# Patient Record
Sex: Female | Born: 1965 | Race: White | Hispanic: No | Smoking: Never smoker
Health system: Southern US, Community
[De-identification: ages and names within clinical notes are randomized; demographics above are authoritative.]

## PROBLEM LIST (undated history)

## (undated) DIAGNOSIS — R011 Cardiac murmur, unspecified: Secondary | ICD-10-CM

## (undated) DIAGNOSIS — E559 Vitamin D deficiency, unspecified: Secondary | ICD-10-CM

## (undated) HISTORY — PX: CHOLECYSTECTOMY: SHX55

## (undated) HISTORY — PX: WRIST SURGERY: SHX841

## (undated) HISTORY — DX: Cardiac murmur, unspecified: R01.1

## (undated) HISTORY — DX: Vitamin D deficiency, unspecified: E55.9

---

## 1998-12-15 ENCOUNTER — Other Ambulatory Visit: Admission: RE | Admit: 1998-12-15 | Discharge: 1998-12-15 | Payer: Self-pay | Admitting: Obstetrics and Gynecology

## 2000-03-28 ENCOUNTER — Other Ambulatory Visit: Admission: RE | Admit: 2000-03-28 | Discharge: 2000-03-28 | Payer: Self-pay | Admitting: Obstetrics and Gynecology

## 2000-11-27 ENCOUNTER — Encounter: Payer: Self-pay | Admitting: Family Medicine

## 2000-11-27 ENCOUNTER — Encounter: Admission: RE | Admit: 2000-11-27 | Discharge: 2000-11-27 | Payer: Self-pay | Admitting: Family Medicine

## 2000-12-31 ENCOUNTER — Encounter: Admission: RE | Admit: 2000-12-31 | Discharge: 2001-01-14 | Payer: Self-pay | Admitting: Neurosurgery

## 2001-02-19 ENCOUNTER — Emergency Department (HOSPITAL_COMMUNITY): Admission: EM | Admit: 2001-02-19 | Discharge: 2001-02-19 | Payer: Self-pay | Admitting: Emergency Medicine

## 2001-02-19 ENCOUNTER — Encounter: Payer: Self-pay | Admitting: Emergency Medicine

## 2002-01-26 ENCOUNTER — Other Ambulatory Visit: Admission: RE | Admit: 2002-01-26 | Discharge: 2002-01-26 | Payer: Self-pay | Admitting: Obstetrics and Gynecology

## 2002-02-26 ENCOUNTER — Encounter: Payer: Self-pay | Admitting: Obstetrics and Gynecology

## 2002-02-26 ENCOUNTER — Encounter: Admission: RE | Admit: 2002-02-26 | Discharge: 2002-02-26 | Payer: Self-pay | Admitting: Obstetrics and Gynecology

## 2002-09-02 ENCOUNTER — Encounter: Payer: Self-pay | Admitting: Obstetrics and Gynecology

## 2002-09-02 ENCOUNTER — Encounter: Admission: RE | Admit: 2002-09-02 | Discharge: 2002-09-02 | Payer: Self-pay | Admitting: Obstetrics and Gynecology

## 2003-03-07 ENCOUNTER — Encounter: Admission: RE | Admit: 2003-03-07 | Discharge: 2003-03-07 | Payer: Self-pay | Admitting: Obstetrics and Gynecology

## 2003-03-07 ENCOUNTER — Encounter: Payer: Self-pay | Admitting: Obstetrics and Gynecology

## 2003-04-21 ENCOUNTER — Other Ambulatory Visit: Admission: RE | Admit: 2003-04-21 | Discharge: 2003-04-21 | Payer: Self-pay | Admitting: Obstetrics and Gynecology

## 2004-08-27 ENCOUNTER — Emergency Department (HOSPITAL_COMMUNITY): Admission: EM | Admit: 2004-08-27 | Discharge: 2004-08-27 | Payer: Self-pay | Admitting: Emergency Medicine

## 2005-03-04 ENCOUNTER — Other Ambulatory Visit: Admission: RE | Admit: 2005-03-04 | Discharge: 2005-03-04 | Payer: Self-pay | Admitting: Obstetrics and Gynecology

## 2006-04-18 ENCOUNTER — Encounter: Admission: RE | Admit: 2006-04-18 | Discharge: 2006-04-18 | Payer: Self-pay | Admitting: Obstetrics and Gynecology

## 2008-12-09 ENCOUNTER — Encounter: Admission: RE | Admit: 2008-12-09 | Discharge: 2008-12-09 | Payer: Self-pay | Admitting: Obstetrics and Gynecology

## 2009-03-05 ENCOUNTER — Emergency Department (HOSPITAL_BASED_OUTPATIENT_CLINIC_OR_DEPARTMENT_OTHER): Admission: EM | Admit: 2009-03-05 | Discharge: 2009-03-05 | Payer: Self-pay | Admitting: Emergency Medicine

## 2009-03-05 ENCOUNTER — Ambulatory Visit: Payer: Self-pay | Admitting: Diagnostic Radiology

## 2009-04-05 ENCOUNTER — Ambulatory Visit (HOSPITAL_BASED_OUTPATIENT_CLINIC_OR_DEPARTMENT_OTHER): Admission: RE | Admit: 2009-04-05 | Discharge: 2009-04-05 | Payer: Self-pay | Admitting: Orthopedic Surgery

## 2010-10-26 LAB — POCT HEMOGLOBIN-HEMACUE: Hemoglobin: 14.6 g/dL (ref 12.0–15.0)

## 2011-12-25 ENCOUNTER — Other Ambulatory Visit: Payer: Self-pay | Admitting: Obstetrics and Gynecology

## 2011-12-25 DIAGNOSIS — R928 Other abnormal and inconclusive findings on diagnostic imaging of breast: Secondary | ICD-10-CM

## 2011-12-30 ENCOUNTER — Ambulatory Visit
Admission: RE | Admit: 2011-12-30 | Discharge: 2011-12-30 | Disposition: A | Discharge status: Home / Self Care | Payer: 59 | Source: Ambulatory Visit | Attending: Obstetrics and Gynecology | Admitting: Obstetrics and Gynecology

## 2011-12-30 ENCOUNTER — Other Ambulatory Visit: Payer: Self-pay | Admitting: Obstetrics and Gynecology

## 2011-12-30 DIAGNOSIS — R928 Other abnormal and inconclusive findings on diagnostic imaging of breast: Secondary | ICD-10-CM

## 2015-06-21 ENCOUNTER — Encounter (HOSPITAL_BASED_OUTPATIENT_CLINIC_OR_DEPARTMENT_OTHER): Payer: Self-pay

## 2015-06-21 ENCOUNTER — Emergency Department (HOSPITAL_BASED_OUTPATIENT_CLINIC_OR_DEPARTMENT_OTHER): Payer: Worker's Compensation

## 2015-06-21 ENCOUNTER — Emergency Department (HOSPITAL_BASED_OUTPATIENT_CLINIC_OR_DEPARTMENT_OTHER)
Admission: EM | Admit: 2015-06-21 | Discharge: 2015-06-21 | Disposition: A | Discharge status: Home / Self Care | Payer: Worker's Compensation | Attending: Emergency Medicine | Admitting: Emergency Medicine

## 2015-06-21 DIAGNOSIS — Y9289 Other specified places as the place of occurrence of the external cause: Secondary | ICD-10-CM | POA: Insufficient documentation

## 2015-06-21 DIAGNOSIS — W108XXA Fall (on) (from) other stairs and steps, initial encounter: Secondary | ICD-10-CM | POA: Insufficient documentation

## 2015-06-21 DIAGNOSIS — Y998 Other external cause status: Secondary | ICD-10-CM | POA: Diagnosis not present

## 2015-06-21 DIAGNOSIS — S99921A Unspecified injury of right foot, initial encounter: Secondary | ICD-10-CM | POA: Diagnosis present

## 2015-06-21 DIAGNOSIS — S93401A Sprain of unspecified ligament of right ankle, initial encounter: Secondary | ICD-10-CM | POA: Insufficient documentation

## 2015-06-21 DIAGNOSIS — S93601A Unspecified sprain of right foot, initial encounter: Secondary | ICD-10-CM

## 2015-06-21 DIAGNOSIS — Y9389 Activity, other specified: Secondary | ICD-10-CM | POA: Diagnosis not present

## 2015-06-21 NOTE — ED Notes (Signed)
Ice pack applied.

## 2015-06-21 NOTE — ED Provider Notes (Signed)
CSN: [id number]     Arrival date & time 06/21/15  2011 History   First MD Initiated Contact with Patient 06/21/15 2122     Chief Complaint  Patient presents with  . Foot Injury     (Consider location/radiation/quality/duration/timing/severity/associated sxs/prior Treatment) The history is provided by the patient and medical records. No language interpreter was used.     Becky Wang [facility] is a 49 y.o. female  with a hx of cholecystectomy presents to the Emergency Department complaining of acute, persistent pain to the right lateral foot after missing a step at work around 6 PM tonight. Patient reports pain to the right lateral foot and left medial ankle with associated swelling and ecchymosis. Patient reports she has been ambulatory with pain to her foot. She has taken ibuprofen with moderate relief. No numbness or tingling. No previous surgeries on the ankle.  Patient reports she did fall striking her right elbow however did not hit her head or have a loss of consciousness.   History reviewed. No pertinent past medical history. Past Surgical History  Procedure Laterality Date  . Cholecystectomy    . Wrist surgery     No family history on file. Social History  Substance Use Topics  . Smoking status: Never Smoker   . Smokeless tobacco: None  . Alcohol Use: Yes     Comment: occ   OB History    No data available     Review of Systems  Constitutional: Negative for fever and chills.  Gastrointestinal: Negative for nausea and vomiting.  Musculoskeletal: Positive for joint swelling and arthralgias. Negative for back pain, neck pain and neck stiffness.  Skin: Negative for wound.  Neurological: Negative for numbness.  Hematological: Does not bruise/bleed easily.  Psychiatric/Behavioral: The patient is not nervous/anxious.   All other systems reviewed and are negative.     Allergies  Review of patient's allergies indicates no known allergies.  Home Medications   Prior to  Admission medications   Not on File   BP 131/59 mmHg  Pulse 61  Temp(Src) 98.5 F (36.9 C) (Oral)  Resp 18  Ht  (1.676 m)  Wt 80.287 kg  BMI 28.58 kg/m2  SpO2 99% Physical Exam  Constitutional: She appears well-developed and well-nourished. No distress.  HENT:  Head: Normocephalic and atraumatic.  Eyes: Conjunctivae are normal.  Neck: Normal range of motion.  Cardiovascular: Normal rate, regular rhythm and intact distal pulses.   Capillary refill < 3 sec  Pulmonary/Chest: Effort normal and breath sounds normal.  Musculoskeletal: She exhibits tenderness. She exhibits no edema.  ROM: Full range of motion of the right hip, knee and ankle Full range of motion of all toes of the right foot Mild ecchymosis to the right lateral foot and right medial ankle with tenderness to palpation in these areas Full range of motion of all major joints in the bilateral upper extremities and left lower extremity without pain  Neurological: She is alert. Coordination normal.  Sensation intact to dull and sharp throughout the right lower extremity Strength 5/5 in the right lower extremity  Skin: Skin is warm and dry. She is not diaphoretic.  No tenting of the skin Abrasion to the right elbow  Psychiatric: She has a normal mood and affect.  Nursing note and vitals reviewed.   ED Course  Procedures (including critical care time) Labs Review Labs Reviewed - No data to display  Imaging Review Dg Foot Complete Right  06/21/2015  CLINICAL DATA:  Status post rolling  of the right foot with right foot pain EXAM: RIGHT FOOT COMPLETE - 3+ VIEW COMPARISON:  None. FINDINGS: There is no evidence of fracture or dislocation. There is minimal plantar calcaneal spur. Soft tissues are unremarkable. IMPRESSION: No acute fracture or dislocation. Electronically Signed   By: Sherian ReinWei-Chen  Lin M.D.   On: 06/21/2015 20:42   I have personally reviewed and evaluated these images and lab results as part of my medical  decision-making.   EKG Interpretation None      MDM   Final diagnoses:  Right ankle sprain, initial encounter  Right foot sprain, initial encounter   [facility] pain.  Patient X-Ray negative for obvious fracture or dislocation. Pain managed in ED. Pt advised to follow up with orthopedics if symptoms persist for possibility of missed fracture diagnosis. Patient given brace while in ED, conservative therapy recommended and discussed. Patient will be dc home & is agreeable with above plan.  BP 131/59 mmHg  Pulse 61  Temp(Src) 98.5 F (36.9 C) (Oral)  Resp 18  Ht 5\' 6"  (1.676 m)  Wt 80.287 kg  BMI 28.58 kg/m2  SpO2 99%   Dierdre ForthHannah Simora Dingee, PA-C 06/21/15 2203  Geoffery Lyonsouglas Delo, MD 06/21/15 2312

## 2015-06-21 NOTE — ED Notes (Signed)
Missed a step at work approx 6pm-pain to right lateral foot

## 2015-06-21 NOTE — Discharge Instructions (Signed)
1. Medications: alternate naprosyn and tylenol for pain control, usual home medications °2. Treatment: rest, ice, elevate and use brace, drink plenty of fluids, gentle stretching °3. Follow Up: Please followup with orthopedics as directed or your PCP in 1 week if no improvement for discussion of your diagnoses and further evaluation after today's visit; if you do not have a primary care doctor use the resource guide provided to find one; Please return to the ER for worsening symptoms or other concerns ° ° ° °Ankle Sprain °An ankle sprain is an injury to the strong, fibrous tissues (ligaments) that hold the bones of your ankle joint together.  °CAUSES °An ankle sprain is usually caused by a fall or by twisting your ankle. Ankle sprains most commonly occur when you step on the outer edge of your foot, and your ankle turns inward. People who participate in sports are more prone to these types of injuries.  °SYMPTOMS  °· Pain in your ankle. The pain may be present at rest or only when you are trying to stand or walk. °· Swelling. °· Bruising. Bruising may develop immediately or within 1 to 2 days after your injury. °· Difficulty standing or walking, particularly when turning corners or changing directions. °DIAGNOSIS  °Your caregiver will ask you details about your injury and perform a physical exam of your ankle to determine if you have an ankle sprain. During the physical exam, your caregiver will press on and apply pressure to specific areas of your foot and ankle. Your caregiver will try to move your ankle in certain ways. An X-ray exam may be done to be sure a bone was not broken or a ligament did not separate from one of the bones in your ankle (avulsion fracture).  °TREATMENT  °Certain types of braces can help stabilize your ankle. Your caregiver can make a recommendation for this. Your caregiver may recommend the use of medicine for pain. If your sprain is severe, your caregiver may refer you to a surgeon who  helps to restore function to parts of your skeletal system (orthopedist) or a physical therapist. °HOME CARE INSTRUCTIONS  °· Apply ice to your injury for 1-2 days or as directed by your caregiver. Applying ice helps to reduce inflammation and pain. °¨ Put ice in a plastic bag. °¨ Place a towel between your skin and the bag. °¨ Leave the ice on for 15-20 minutes at a time, every 2 hours while you are awake. °· Only take over-the-counter or prescription medicines for pain, discomfort, or fever as directed by your caregiver. °· Elevate your injured ankle above the level of your heart as much as possible for 2-3 days. °· If your caregiver recommends crutches, use them as instructed. Gradually put weight on the affected ankle. Continue to use crutches or a cane until you can walk without feeling pain in your ankle. °· If you have a plaster splint, wear the splint as directed by your caregiver. Do not rest it on anything harder than a pillow for the first 24 hours. Do not put weight on it. Do not get it wet. You may take it off to take a shower or bath. °· You may have been given an elastic bandage to wear around your ankle to provide support. If the elastic bandage is too tight (you have numbness or tingling in your foot or your foot becomes cold and blue), adjust the bandage to make it comfortable. °· If you have an air splint, you may blow more   air into it or let air out to make it more comfortable. You may take your splint off at night and before taking a shower or bath. Wiggle your toes in the splint several times per day to decrease swelling. °SEEK MEDICAL CARE IF:  °· You have rapidly increasing bruising or swelling. °· Your toes feel extremely cold or you lose feeling in your foot. °· Your pain is not relieved with medicine. °SEEK IMMEDIATE MEDICAL CARE IF: °· Your toes are numb or blue. °· You have severe pain that is increasing. °MAKE SURE YOU:  °· Understand these instructions. °· Will watch your  condition. °· Will get help right away if you are not doing well or get worse. °  °This information is not intended to replace advice given to you by your health care provider. Make sure you discuss any questions you have with your health care provider. °  °Document Released: 07/08/2005 Document Revised: 07/29/2014 Document Reviewed: 07/20/2011 °Elsevier Interactive Patient Education ©2016 Elsevier Inc. ° ° ° °Emergency Department Resource Guide °1) Find a Doctor and Pay Out of Pocket °Although you won't have to find out who is covered by your insurance plan, it is a good idea to ask around and get recommendations. You will then need to call the office and see if the doctor you have chosen will accept you as a new patient and what types of options they offer for patients who are self-pay. Some doctors offer discounts or will set up payment plans for their patients who do not have insurance, but you will need to ask so you aren't surprised when you get to your appointment. ° °2) Contact Your Local Health Department °Not all health departments have doctors that can see patients for sick visits, but many do, so it is worth a call to see if yours does. If you don't know where your local health department is, you can check in your phone book. The CDC also has a tool to help you locate your state's health department, and many state websites also have listings of all of their local health departments. ° °3) Find a Walk-in Clinic °If your illness is not likely to be very severe or complicated, you may want to try a walk in clinic. These are popping up all over the country in pharmacies, drugstores, and shopping centers. They're usually staffed by nurse practitioners or physician assistants that have been trained to treat common illnesses and complaints. They're usually fairly quick and inexpensive. However, if you have serious medical issues or chronic medical problems, these are probably not your best option. ° °No Primary  Care Doctor: °- Call Health Connect at  832-8000 - they can help you locate a primary care doctor that  accepts your insurance, provides certain services, etc. °- Physician Referral Service- 1-800-533-3463 ° °Chronic Pain Problems: °Organization         Address  Phone   Notes  °[facility]  (336) 297-2271 Patients need to be referred by their primary care doctor.  ° °Medication Assistance: °Organization         Address  Phone   Notes  °[place] [facility] [address] °[place], [place] [address] (336) 641-8030 --Must be a resident of [place] °-- Must have NO insurance coverage whatsoever (no Medicaid/ Medicare, etc.) °-- The pt. MUST have a primary care doctor that directs their care regularly and follows them in the community °  °[facility]  (866) 331-1348   °  [employer]  (888) 892-1162   ° °Agencies that provide inexpensive medical care: °Organization         Address  Phone   Notes  °[facility]  (336) 832-8035   °[facility]    (336) 832-7272   °[facility] [address] °[place], [place] [address] (336) 832-4777   °[facility] [address], °[place] (336) 271-4999   °[facility]    (336) 373-0678   °[facility]    (336) 272-1050   °[facility] ° [address], [place] Phone:  (336) 832-4444, Fax:  (336) 832-4440 Hours of Operation:  9 am - 6 pm, M-F.  Also accepts Medicaid/Medicare and self-pay.  °[facility] ° [address], [place] Phone: (336) 832-3150, Fax: (336) 832-3151. Hours of Operation:  8:30 am - 5:30 pm, M-F.  Also accepts Medicaid and self-pay.  °[facility] [place] [address], [place] Phone: (336) 878-6027   °[facility] [address], [place], [place] (336)723-1848, Ext. 123 Mondays & Thursdays: 7-9 AM.  First 15 patients are seen on a first  come, first serve basis. °  ° °Medicaid-accepting [place] Providers: ° °Organization         Address  Phone   Notes  °[facility] [address], [place] (336) 641-2100 Also accepts self-pay patients.  °[facility] [address], [place] ° (336) 856-9996   °[facility] [address], [place] (336) 288-8857   °[facility] [address] [place] [address], [place] (336) 299-7000   °Veita Bland [address], [place]  ° (336) 373-1557 Only accepts [place] Access Medicaid patients after they have their name applied to their card.  ° °Self-Pay (no insurance) in [place]: ° °Organization         Address  Phone   Notes  °Sickle Cell Patients, [facility] [address], [place] (336) 832-1970   °[facility] [address], [place] (336) 832-4400   °[facility] ° [address], [place] (336) 992-4800   °[facility]/Dr. Osei-Bonsu ° [address] [place] [address], [place] or [address], [place] (336) 841-8500 Phone number for both [place] and [place] locations is the same.  °[facility] [address], [place] (336) 299-0000   °[facility] [address] [place] [address], [place] or [address] (336) 852-7530 °(336) 878-2260   °[facility] [address], [place] (336) 350-1642, phone; (336) 294-5005, fax Sees patients 1st and 3rd Saturday of every month.  Must not qualify for public or private insurance (i.e. Medicaid, Medicare, [place] Health Choice, Veterans' Benefits) • Household income should be no more than 200% of the poverty level •The clinic cannot treat you if you are pregnant or think you are pregnant • Sexually transmitted diseases are not treated at the clinic.  ° ° °Dental Care: °Organization          Address  Phone  Notes  °[facility] [facility] [address],  (336) 641-6152 Accepts children up to age 21 who are enrolled in Medicaid or [place] Health Choice; pregnant women with a Medicaid card;   and children who have applied for Medicaid or [place] Health Choice, but were declined, whose parents can pay a reduced fee at time of service.  °[facility] [place]  [address], [place] (336) 641-7733 Accepts children up to age 21 who are enrolled in Medicaid or [place] Health Choice; pregnant women with a Medicaid card; and children who have applied for Medicaid or [place] Health Choice, but were declined, whose parents can pay a reduced fee at time of service.  °[facility] ° [address], [place] (336) 641-4533 Patients are seen by appointment only. Walk-ins are not accepted. [facility] will see patients 18 years of age and older. °Monday - Tuesday (8am-5pm) °Most Wednesdays (8:30-5pm) °$30 per visit, cash only  °[facility] ° [address], [place] (336) 641-4533 Patients are seen by appointment only. Walk-ins are not accepted. [facility] will see patients 18 years of age and older. °One Wednesday Evening (Monthly: Volunteer Based).  $30 per visit, cash only  °[facility]  (919) 537-3737 for adults; Children under age 4, call Graduate Pediatric Dentistry at ([phone]) [phone]. Children aged 4-14, please call ([phone]) [phone] to request a pediatric application. ° Dental services are provided in all areas of dental care including fillings, crowns and bridges, complete and partial dentures, implants, gum treatment, root canals, and extractions. Preventive care is also provided. Treatment is provided to both adults and children. °Patients are selected via a lottery and there is often a waiting list. °  °[facility] [address], °[place] ° (336) 763-8833 [email or web address] °  °[facility] [address], [place], [place] (336)723-1848, Ext. 123 Second and Fourth Thursday of each month, opens at 6:30 AM; Clinic ends at 9 AM.  Patients are seen on a first-come first-served basis, and a limited number are seen during each clinic.  ° °[facility] ° [address], [place], [place] (336) 723-7904   Eligibility Requirements °You must have lived in [place], [place], or [place] counties for at least the last three months. °  You cannot be eligible for state or federal sponsored healthcare insurance, including Veterans Administration, Medicaid, or Medicare. °  You generally cannot be eligible for healthcare insurance through your employer.  °  How to apply: °Eligibility screenings are held every Tuesday and Wednesday afternoon from 1:00 pm until 4:00 pm. You do not need an appointment for the interview!  °[facility] [address], [place], [place] 336-631-2330   °[facility]  336-342-8273   °[facility]  336-703-3100   °[facility]  336-570-6415   ° °Behavioral Health Resources in the Community: °Intensive Outpatient Programs °Organization         Address  Phone  Notes  °[facility] [address], [place], [place] 336-878-6098   °[facility] [address], [place], [place] 336-832-9800   °[facility] [address], [place], [place] ° 336-882-2125   °[facility] [address],  °[place], [place] 1-800-853-5163 or 336-641-4981   °Substance Abuse Resources °Organization         Address  Phone  Notes  °[facility]  336-882-2125   °[facility]  336-784-9470   °[facility]  336-285-9073   °[facility]  336-845-3988   °Residential &   Outpatient Substance Abuse Program  1-800-659-3381   °Psychological  Services °Organization         Address  Phone  Notes  °[facility]  336- 832-9600   °[facility]  336- 378-7881   °[facility] [address], [place] 1-800-853-5163 or 336-641-4981   ° °Mobile Crisis Teams °Organization         Address  Phone  Notes  °[facility], Mobile Crisis Care Unit  1-877-626-1772   °Assertive °[facility] ° [address] [place], [place] 336-834-9664   °Sharon DeEsch [address] °[place] 336-554-5454   ° °Self-Help/Support Groups °Organization         Address  Phone             Notes  °[facility] [place] - variety of support groups  336- 373-1402 Call for more information  °Narcotics Anonymous (NA), [facility] [address], °[place]  2 meetings at this location  ° °Residential Treatment Programs °Organization         Address  Phone  Notes  °[facility] [address],    °[place]  1-866-801-8205   °[facility] ° [address], [place], [place] 704-293-8524   °[facility] [address], [place] 336-845-3988 Admissions: 8am-3pm M-F  °[facility] [address]    °[place], [place] 336-841-1104   °[facility] [address], [place], [place] 336-379-7146   °[facility] [address]  °[place], [place] 336-285-9073   °[facility] Intensive Outpatient [address], [place], [place] 336-852-3033   °[facility] [address]  °[place], [place] 1-877-615-2722 or 336-784-9470   °[facility] [address] [place], [place] 336-227-7417 Accepts Medicaid  °[facility] [address]  ° [place] 1-800-659-3381 Substance Abuse/Addiction Treatment  ° °[place] Behavioral Health Resources °Organization         Address  Phone  Notes  °[facility]  (888)  581-9988   °Julie Brannon, PhD [address] [place], [place]   (336) 349-5553 or (336) 951-0000   °[facility]   [address] °[place], [place] (336) 349-4454   °[facility] [address], [place], [place] (336) 342-8316 Insurance/Medicaid/sponsorship through [facility]  °[facility] [address]                                     [place], [place] (336) 342-8316 Therapy/tele-psych/case  °[facility] [address]  ° [place], [place] (336) 349-2233    °Dr. Arfeen  (336) 349-4544   °[facility] [place]  [employer] [place] [facility] 1) [address], [place] °2) [address], [place] °3)  [address], [place] (336) 349-3220 °(336) 342-7768 ° °(336) 342-8140   °[place] [facility] (336) 342-1394 or (336) 342-3537 (After Hours)    ° ° ° °

## 2019-03-05 ENCOUNTER — Telehealth: Payer: Self-pay

## 2019-03-05 NOTE — Telephone Encounter (Signed)
Notes on file from Dr Radene Knee [phone] sent referral to scheduling

## 2019-04-05 ENCOUNTER — Ambulatory Visit (INDEPENDENT_AMBULATORY_CARE_PROVIDER_SITE_OTHER): Payer: 59 | Admitting: Cardiology

## 2019-04-05 ENCOUNTER — Other Ambulatory Visit: Payer: Self-pay

## 2019-04-05 ENCOUNTER — Encounter: Payer: Self-pay | Admitting: Cardiology

## 2019-04-05 VITALS — BP 140/92 | HR 58 | Temp 97.9°F | Ht 65.0 in | Wt 207.8 lb

## 2019-04-05 DIAGNOSIS — R0602 Shortness of breath: Secondary | ICD-10-CM | POA: Diagnosis not present

## 2019-04-05 DIAGNOSIS — Z01812 Encounter for preprocedural laboratory examination: Secondary | ICD-10-CM | POA: Diagnosis not present

## 2019-04-05 DIAGNOSIS — R011 Cardiac murmur, unspecified: Secondary | ICD-10-CM

## 2019-04-05 DIAGNOSIS — R002 Palpitations: Secondary | ICD-10-CM | POA: Diagnosis not present

## 2019-04-05 DIAGNOSIS — R079 Chest pain, unspecified: Secondary | ICD-10-CM | POA: Diagnosis not present

## 2019-04-05 DIAGNOSIS — R001 Bradycardia, unspecified: Secondary | ICD-10-CM

## 2019-04-05 MED ORDER — METOPROLOL TARTRATE 50 MG PO TABS: 50.0000 mg | ORAL_TABLET | Freq: Once | ORAL | 0 refills | Status: DC

## 2019-04-05 NOTE — Progress Notes (Signed)
Cardiology Office Note:    Date:  04/05/2019   ID:  Becky Wang, DOB 22-May-1966, MRN [id number]  PCP:  Patient, [name] Per  Cardiologist:  Thomasene Ripple, DO  Electrophysiologist:  None   Referring MD: Richardean Chimera, MD   "Here to be checked out for chest pain".   History of Present Illness:    Becky Wang is a 53 y.o. female with a hx of reported heart murmur, vitamin D deficiency and family history of premature coronary disease comes in to be evaluated for chest pain and palpitations.  The patient reports that recently she has been experiencing left-sided chest pain which is nonradiating.  She however describes it as dull in nature and just very intense.  She states that it happens both at rest and on exertion.  She notes that the last for about 5 to 10 minutes.  Which she quantifies at 5 out of 10.  She admits to associated shortness of breath, denies any nausea, vomiting, lightheadedness.  In addition she also tells me that she has been experiencing palpitations for couple months now.  She states that usually during the day she does not experience any palpitation however at night she had rapid onset of palpitations which lasts for at least 20 to 30 minutes.  She states while this is happening she feels like her heart is also skipping a beat.  She denies any lightheadedness or dizziness.  However also does have shortness of breath during these episodes.  Chest pain in the office today.   Past Medical History:  Diagnosis Date  . Heart murmur   . Vitamin D deficiency     Past Surgical History:  Procedure Laterality Date  . CHOLECYSTECTOMY    . WRIST SURGERY      Current Medications: Current Meds  Medication Sig  . meloxicam (MOBIC) 15 MG tablet Take 15 mg by mouth daily as needed.  Marland Kitchen oxybutynin (DITROPAN XL) 15 MG 24 hr tablet Take 15 mg by mouth daily.  . Vitamin D, Ergocalciferol, (DRISDOL) 1.25 MG (50000 UT) CAPS capsule Take 50,000 Units by mouth every 7 (seven)  days.     Allergies:   Patient has no known allergies.   Social History   Socioeconomic History  . Marital status: Divorced    Spouse name: Not on file  . Number of children: Not on file  . Years of education: Not on file  . Highest education level: Not on file  Occupational History  . Not on file  Social Needs  . Financial resource strain: Not on file  . Food insecurity    Worry: Not on file    Inability: Not on file  . Transportation needs    Medical: Not on file    Non-medical: Not on file  Tobacco Use  . Smoking status: Never Smoker  . Smokeless tobacco: Never Used  Substance and Sexual Activity  . Alcohol use: Yes    Comment: occ  . Drug use: No  . Sexual activity: Yes    Birth control/protection: I.U.D.  Lifestyle  . Physical activity    Days per week: Not on file    Minutes per session: Not on file  . Stress: Not on file  Relationships  . Social Musician on phone: Not on file    Gets together: Not on file    Attends religious service: Not on file    Active member of club or organization: Not on file  Attends meetings of clubs or organizations: Not on file    Relationship status: Not on file  Other Topics Concern  . Not on file  Social History Narrative  . Not on file     Family History: The patient's family history includes Congestive Heart Failure in an other family member; Fibromyalgia in her sister; Heart attack in her father; Hypertension in her mother; Other in her sister.  ROS:      Review of Systems  Constitution: Negative for decreased appetite, fever and weight loss.  HENT: Negative for congestion, hearing loss and sore throat.   Eyes: Negative for blurred vision, double vision and photophobia.  Cardiovascular: Positive for chest pain and palpitations. Negative for near-syncope and syncope.  Respiratory: Positive for shortness of breath. Negative for cough, sputum production and wheezing.   Endocrine: Negative for cold  intolerance and polyuria.  Skin: Negative for nail changes, poor wound healing and suspicious lesions.  Musculoskeletal: Negative for arthritis and joint pain.  Gastrointestinal: Negative for bloating, constipation, hemorrhoids and nausea.  Genitourinary: Negative for bladder incontinence, genital sores, hematuria and incomplete emptying.  Neurological: Negative for excessive daytime sleepiness, focal weakness, seizures and weakness.  Psychiatric/Behavioral: Negative for depression and substance abuse. The patient does not have insomnia.   Allergic/Immunologic: Negative for HIV exposure and persistent infections.     EKGs/Labs/Other Studies Reviewed:    The following studies were reviewed today:   EKG: The ekg ordered today demonstrates sinus bradycardia, heart rate 58 bpm.  Recent Labs: No results found for requested labs within last 8760 hours.  Recent Lipid Panel No results found for: CHOL, TRIG, HDL, CHOLHDL, VLDL, LDLCALC, LDLDIRECT  Physical Exam:    VS:  BP (!) 140/92 (BP Location: Left Arm, Patient Position: Sitting, Cuff Size: Large)   Pulse (!) 58   Temp 97.9 F (36.6 C)   Ht 5\' 5"  (1.651 m)   Wt 207 lb 12.8 oz (94.3 kg)   SpO2 98%   BMI 34.58 kg/m     Wt Readings from Last 3 Encounters:  04/05/19 207 lb 12.8 oz (94.3 kg)  06/21/15 177 lb (80.3 kg)     GEN: Well nourished, well developed in no acute distress HEENT: Normal NECK: No JVD; No carotid bruits LYMPHATICS: No lymphadenopathy CARDIAC: S1-S2 noted RRR, II/VI soft systolic murmurs, rubs, gallops RESPIRATORY:  Clear to auscultation without rales, wheezing or rhonchi  ABDOMEN: Soft, non-tender, non-distended EXTREMITIES: No cyanosis, no clubbing, no edema. MUSCULOSKELETAL:  No edema; No deformity  SKIN: Warm and dry NEUROLOGIC:  Alert and oriented x 3 PSYCHIATRIC:  Normal affect   ASSESSMENT:    1. Palpitations   2. Chest pain, unspecified type   3. Pre-procedure lab exam   4. Shortness of  breath   5. Murmur, heart    PLAN:    In order of problems listed above:  She does have family history of coronary artery disease therefore her chest pain does concerns me .with this is appropriate to a CTA of the coronaries.  She has been advised of the risk and benefits of this procedure.  She does not have any contrast allergies.   She will wear a zio patch for 7 days to investigate her palpitations.  A transthoracic echocardiogram has been ordered to assess LV function/RV function and also for any structural abnormalities. She does have systolic murmur which I do believe is physiologic.  Her blood pressure is elevated in the office today: Right 140/90 left upper arm 142/92-this was manually  performed.  She reports that her blood pressure is always high when she is in a doctor's office and takes it daily at home.  She tells me her readings are usually between 128-130 with diastolics in the 70s.  Of asked the patient to take her blood pressure daily, report in 2 months with her blood pressure monitor system at which time we will reassess.  EKG with a heart rate of 58, TSH normal within the last year.  She is not on any AV nodal blockers. No lightheadedness or dizziness.  We will continue to monitor.  In addition she would like to exercise, and per our discussion this will be fine and she can exercise at home as tolerated.  She was advised if you experience any worsening chest pain or palpitation.  To stop the exercise and if persist go to the nearest ED.  She is in agreement with the above plan.  All her questions were answered during this visit.  She left office in stable condition.    Medication Adjustments/Labs and Tests Ordered: Current medicines are reviewed at length with the patient today.  Concerns regarding medicines are outlined above.  Orders Placed This Encounter  Procedures  . CT CORONARY FRACTIONAL FLOW RESERVE DATA PREP  . CT CORONARY FRACTIONAL FLOW RESERVE FLUID ANALYSIS   . CT CORONARY MORPH W/CTA COR W/SCORE W/CA W/CM &/OR WO/CM  . Basic Metabolic Panel (BMET)  . LONG TERM MONITOR (3-14 DAYS)  . EKG 12-Lead  . ECHOCARDIOGRAM COMPLETE   Meds ordered this encounter  Medications  . metoprolol tartrate (LOPRESSOR) 50 MG tablet    Sig: Take 1 tablet (50 mg total) by mouth once for 1 dose.    Dispense:  2 tablet    Refill:  0    Patient Instructions  Medication Instructions:  Your physician has recommended you make the following change in your medication:   TAKE: Metoprolol 50 mg Take 2 pills 2 hours prior to CT  If you need a refill on your cardiac medications before your next appointment, please call your pharmacy.   Lab work: Your physician recommends that you return for lab work in:   3-7 days prior to CT: BMP  If you have labs (blood work) drawn today and your tests are completely normal, you will receive your results only by: Marland Kitchen. MyChart Message (if you have MyChart) OR . A paper copy in the mail If you have any lab test that is abnormal or we need to change your treatment, we will call you to review the results.  Testing/Procedures: Your physician has requested that you have an echocardiogram. Echocardiography is a painless test that uses sound waves to create images of your heart. It provides your doctor with information about the size and shape of your heart and how well your heart's chambers and valves are working. This procedure takes approximately one hour. There are no restrictions for this procedure.  Your physician has requested that you have cardiac CT. Cardiac computed tomography (CT) is a painless test that uses an x-ray machine to take clear, detailed pictures of your heart. For further information please visit [email or web address]. Please follow instruction sheet as given.  Your cardiac CT will be scheduled at one of the below locations:   [facility] [address] [place], [place] [address] [phone]  If  scheduled at [facility], please arrive at the [facility] main entrance of [facility]  30-45 minutes prior to test start time. Proceed  to the [facility] Radiology Department (first floor) to check-in and test prep.   Please follow these instructions carefully (unless otherwise directed):  On the Night Before the Test: . Be sure to Drink plenty of water. . Do not consume any caffeinated/decaffeinated beverages or chocolate 12 hours prior to your test. . Do not take any antihistamines 12 hours prior to your test.  On the Day of the Test: . Drink plenty of water. Do not drink any water within one hour of the test. . Do not eat any food 4 hours prior to the test. . You may take your regular medications prior to the test.  . Take metoprolol (Lopressor) two hours prior to test. . FEMALES- please wear underwire-free bra if available     After the Test: . Drink plenty of water. . After receiving IV contrast, you may experience a mild flushed feeling. This is normal. . On occasion, you may experience a mild rash up to 24 hours after the test. This is not dangerous. If this occurs, you can take Benadryl 25 mg and increase your fluid intake. . If you experience trouble breathing, this can be serious. If it is severe call 911 IMMEDIATELY. If it is mild, please call our office.  Please contact the cardiac imaging nurse navigator should you have any questions/concerns Rockwell AlexandriaSara Wallace, RN Navigator Cardiac Imaging [facility] [phone] Office  [phone] Cell   Follow-Up: At [facility], you and your health needs are our priority.  As part of our continuing mission to provide you with exceptional heart care, we have created designated Provider Care Teams.  These Care Teams include your primary Cardiologist (physician) and Advanced Practice Providers (APPs -  Physician Assistants and Nurse Practitioners) who all work together to provide you with the  care you need, when you need it. You will need a follow up appointment in 2 months. You may see Gwynevere Lizana, DO. Any Other Special Instructions Will Be Listed Below (If Applicable).       Osvaldo ShipperSigned, Briseidy Spark, DO  04/05/2019 9:57 AM    [facility]

## 2019-04-05 NOTE — Patient Instructions (Addendum)
Medication Instructions:  Your physician has recommended you make the following change in your medication:   TAKE: Metoprolol 50 mg Take 2 pills 2 hours prior to CT  If you need a refill on your cardiac medications before your next appointment, please call your pharmacy.   Lab work: Your physician recommends that you return for lab work in:   3-7 days prior to CT: BMP  If you have labs (blood work) drawn today and your tests are completely normal, you will receive your results only by: Marland Kitchen MyChart Message (if you have MyChart) OR . A paper copy in the mail If you have any lab test that is abnormal or we need to change your treatment, we will call you to review the results.  Testing/Procedures: Your physician has requested that you have an echocardiogram. Echocardiography is a painless test that uses sound waves to create images of your heart. It provides your doctor with information about the size and shape of your heart and how well your heart's chambers and valves are working. This procedure takes approximately one hour. There are no restrictions for this procedure.  Your physician has recommended that you wear a ZIO monitor. ZIO monitors are medical devices that record the heart's electrical activity. Doctors most often use these monitors to diagnose arrhythmias. Arrhythmias are problems with the speed or rhythm of the heartbeat. The monitor is a small, portable device. You can wear one while you do your normal daily activities. This is usually used to diagnose what is causing palpitations/syncope (passing out).  Wear 14 days  Your physician has requested that you have cardiac CT. Cardiac computed tomography (CT) is a painless test that uses an x-ray machine to take clear, detailed pictures of your heart. For further information please visit [email or web address].[email or web address]. Please follow instruction sheet as given.  Your cardiac CT will be scheduled at one of the below locations:   [facility] [address] [place], [place] [address] [phone]  If scheduled at [facility], please arrive at the [facility] main entrance of [facility] 30-45 minutes prior to test start time. Proceed to the [facility] Radiology Department (first floor) to check-in and test prep.   Please follow these instructions carefully (unless otherwise directed):  On the Night Before the Test: . Be sure to Drink plenty of water. . Do not consume any caffeinated/decaffeinated beverages or chocolate 12 hours prior to your test. . Do not take any antihistamines 12 hours prior to your test.  On the Day of the Test: . Drink plenty of water. Do not drink any water within one hour of the test. . Do not eat any food 4 hours prior to the test. . You may take your regular medications prior to the test.  . Take metoprolol (Lopressor) two hours prior to test. . FEMALES- please wear underwire-free bra if available     After the Test: . Drink plenty of water. . After receiving IV contrast, you may experience a mild flushed feeling. This is normal. . On occasion, you may experience a mild rash up to 24 hours after the test. This is not dangerous. If this occurs, you can take Benadryl 25 mg and increase your fluid intake. . If you experience trouble breathing, this can be serious. If it is severe call 911 IMMEDIATELY. If it is mild, please call our office.  Please contact the cardiac imaging nurse navigator should you have any questions/concerns Marchia Bond, RN Navigator Cardiac  Imaging [facility] [phone] Office  [phone] Cell   Follow-Up: At [facility], you and your health needs are our priority.  As part of our continuing mission to provide you with exceptional heart care, we have created designated Provider Care Teams.  These Care Teams include your primary Cardiologist (physician) and Advanced Practice Providers (APPs -  Physician  Assistants and Nurse Practitioners) who all work together to provide you with the care you need, when you need it. You will need a follow up appointment in 2 months. You may see Kardie Tobb, DO. Any Other Special Instructions Will Be Listed Below (If Applicable).

## 2019-04-08 ENCOUNTER — Other Ambulatory Visit: Payer: Self-pay

## 2019-04-08 ENCOUNTER — Ambulatory Visit (HOSPITAL_BASED_OUTPATIENT_CLINIC_OR_DEPARTMENT_OTHER)
Admission: RE | Admit: 2019-04-08 | Discharge: 2019-04-08 | Disposition: A | Discharge status: Home / Self Care | Payer: 59 | Source: Ambulatory Visit | Attending: Cardiology | Admitting: Cardiology

## 2019-04-08 DIAGNOSIS — R079 Chest pain, unspecified: Secondary | ICD-10-CM

## 2019-04-08 NOTE — Progress Notes (Signed)
  Echocardiogram 2D Echocardiogram has been performed.  Cardell Peach 04/08/2019, 3:21 PM

## 2019-04-12 ENCOUNTER — Ambulatory Visit (INDEPENDENT_AMBULATORY_CARE_PROVIDER_SITE_OTHER): Payer: 59

## 2019-04-12 DIAGNOSIS — R002 Palpitations: Secondary | ICD-10-CM | POA: Diagnosis not present

## 2019-05-06 ENCOUNTER — Telehealth (HOSPITAL_COMMUNITY): Payer: Self-pay | Admitting: Emergency Medicine

## 2019-05-06 NOTE — Telephone Encounter (Signed)
Reaching out to patient to offer assistance regarding upcoming cardiac imaging study; pt verbalizes understanding of appt date/time, parking situation and where to check in, pre-test NPO status and medications ordered, and verified current allergies; name and call back number provided for further questions should they arise Shady Padron RN Navigator Cardiac Imaging [facility] [phone] office [phone] cell 

## 2019-05-07 ENCOUNTER — Ambulatory Visit (HOSPITAL_COMMUNITY)
Admission: RE | Admit: 2019-05-07 | Discharge: 2019-05-07 | Disposition: A | Discharge status: Home / Self Care | Payer: 59 | Source: Ambulatory Visit | Attending: Cardiology | Admitting: Cardiology

## 2019-05-07 ENCOUNTER — Other Ambulatory Visit: Payer: Self-pay

## 2019-05-07 DIAGNOSIS — R079 Chest pain, unspecified: Secondary | ICD-10-CM

## 2019-05-07 DIAGNOSIS — Z006 Encounter for examination for normal comparison and control in clinical research program: Secondary | ICD-10-CM

## 2019-05-07 MED ORDER — NITROGLYCERIN 0.4 MG SL SUBL
0.8000 mg | SUBLINGUAL_TABLET | Freq: Once | SUBLINGUAL | Status: AC
  Administered 2019-05-07: 0.8 mg via SUBLINGUAL

## 2019-05-07 MED ORDER — IOHEXOL 350 MG/ML SOLN
80.0000 mL | Freq: Once | INTRAVENOUS | Status: AC | PRN
  Administered 2019-05-07: 13:00:00 80 mL via INTRAVENOUS

## 2019-05-07 MED ORDER — NITROGLYCERIN 0.4 MG SL SUBL
SUBLINGUAL_TABLET | SUBLINGUAL | Status: AC
  Filled 2019-05-07: qty 2

## 2019-05-07 NOTE — Research (Signed)
Cadfem Informed Consent    Patient Name: Becky Wang [facility]   Subject met inclusion and exclusion criteria.  The informed consent form, study requirements and expectations were reviewed with the subject and questions and concerns were addressed prior to the signing of the consent form.  The subject verbalized understanding of the trail requirements.  The subject agreed to participate in the CADFEM trial and signed the informed consent.  The informed consent was obtained prior to performance of any protocol-specific procedures for the subject.  A copy of the signed informed consent was given to the subject and a copy was placed in the subject's medical record.   Neva Seat

## 2019-05-07 NOTE — Progress Notes (Signed)
CT scan completed. Tolerated well. D/C home walking. Awake and alert. In no distress. 

## 2019-05-17 ENCOUNTER — Telehealth: Payer: Self-pay | Admitting: *Deleted

## 2019-05-17 NOTE — Telephone Encounter (Signed)
-----   Message from Berniece Salines, DO sent at 05/15/2019 10:16 PM EDT ----- Her CT does not show any blockage, but I am not happy with the study because two of the arteries are not well seen. If she is still having chest pain, I will like to see her and discuss other options for testing .

## 2019-05-17 NOTE — Telephone Encounter (Signed)
Telephone call to ptient. Left message to return call

## 2019-06-03 ENCOUNTER — Encounter: Payer: Self-pay | Admitting: Cardiology

## 2019-06-03 ENCOUNTER — Other Ambulatory Visit: Payer: Self-pay

## 2019-06-03 ENCOUNTER — Ambulatory Visit (INDEPENDENT_AMBULATORY_CARE_PROVIDER_SITE_OTHER): Payer: 59 | Admitting: Cardiology

## 2019-06-03 VITALS — BP 122/88 | HR 79 | Ht 65.0 in | Wt 206.0 lb

## 2019-06-03 DIAGNOSIS — I4729 Other ventricular tachycardia: Secondary | ICD-10-CM

## 2019-06-03 DIAGNOSIS — I472 Ventricular tachycardia: Secondary | ICD-10-CM | POA: Diagnosis not present

## 2019-06-03 DIAGNOSIS — I471 Supraventricular tachycardia: Secondary | ICD-10-CM | POA: Diagnosis not present

## 2019-06-03 MED ORDER — METOPROLOL SUCCINATE ER 25 MG PO TB24: 12.5000 mg | ORAL_TABLET | Freq: Every day | ORAL | 0 refills | Status: DC

## 2019-06-03 NOTE — Patient Instructions (Signed)
Medication Instructions:  Your physician has recommended you make the following change in your medication:   START: TOPROL XL 12.5 MG DAILY  *If you need a refill on your cardiac medications before your next appointment, please call your pharmacy*  Lab Work: Your physician recommends that you return for lab work in: [place]  If you have labs (blood work) drawn today and your tests are completely normal, you will receive your results only by: Marland Kitchen MyChart Message (if you have MyChart) OR . A paper copy in the mail If you have any lab test that is abnormal or we need to change your treatment, we will call you to review the results.  Testing/Procedures: None Ordered  Follow-Up: At [facility], you and your health needs are our priority.  As part of our continuing mission to provide you with exceptional heart care, we have created designated Provider Care Teams.  These Care Teams include your primary Cardiologist (physician) and Advanced Practice Providers (APPs -  Physician Assistants and Nurse Practitioners) who all work together to provide you with the care you need, when you need it.  Your next appointment:   3 months  The format for your next appointment:   In Person  Provider:   You may see Berniece Salines, DO  or the following Advanced Practice Provider on your designated Care Team:    Laurann Montana, FNP

## 2019-06-03 NOTE — Progress Notes (Signed)
Cardiology Office Note:    Date:  06/03/2019   [facility] DOB 1966/04/11, MRN [id number]  PCP:  Patient, [name] Per  Cardiologist:  Thomasene RippleKardie Shulamit Donofrio, DO  Electrophysiologist:  None   Referring MD: No ref. provider found   Chief Complaint  Patient presents with  . Follow-up    History of Present Illness:    Becky Wang is a 53 y.o. female with a hx of history of premature coronary artery disease in her family.  Presented on April 05, 2019 at that time she reported chest pain as well as palpitations.  During the conclusion of visit I recommended the patient undergo a coronary CTA, transthoracic echocardiogram and wear a ZIO monitor.   In the interim the patient was able to undergo all her diagnostic testing.  He is here today for follow-up visit to discuss her results.  Past Medical History:  Diagnosis Date  . Heart murmur   . Vitamin D deficiency     Past Surgical History:  Procedure Laterality Date  . CHOLECYSTECTOMY    . WRIST SURGERY      Current Medications: Current Meds  Medication Sig  . meloxicam (MOBIC) 15 MG tablet Take 15 mg by mouth daily as needed.  Marland Kitchen. oxybutynin (DITROPAN XL) 15 MG 24 hr tablet Take 15 mg by mouth daily.     Allergies:   Patient has no known allergies.   Social History   Socioeconomic History  . Marital status: Divorced    Spouse name: Not on file  . Number of children: Not on file  . Years of education: Not on file  . Highest education level: Not on file  Occupational History  . Not on file  Social Needs  . Financial resource strain: Not on file  . Food insecurity    Worry: Not on file    Inability: Not on file  . Transportation needs    Medical: Not on file    Non-medical: Not on file  Tobacco Use  . Smoking status: Never Smoker  . Smokeless tobacco: Never Used  Substance and Sexual Activity  . Alcohol use: Yes    Comment: occ  . Drug use: No  . Sexual activity: Yes    Birth control/protection: I.U.D.   Lifestyle  . Physical activity    Days per week: Not on file    Minutes per session: Not on file  . Stress: Not on file  Relationships  . Social Musicianconnections    Talks on phone: Not on file    Gets together: Not on file    Attends religious service: Not on file    Active member of club or organization: Not on file    Attends meetings of clubs or organizations: Not on file    Relationship status: Not on file  Other Topics Concern  . Not on file  Social History Narrative  . Not on file     Family History: The patient's family history includes Congestive Heart Failure in an other family member; Fibromyalgia in her sister; Heart attack in her father; Hypertension in her mother; Other in her sister.  ROS:   Review of Systems  Constitution: Negative for decreased appetite, fever and weight gain.  HENT: Negative for congestion, ear discharge, hoarse voice and sore throat.   Eyes: Negative for discharge, redness, vision loss in right eye and visual halos.  Cardiovascular: Negative for chest pain, dyspnea on exertion, leg swelling, orthopnea and palpitations.  Respiratory: Negative  for cough, hemoptysis, shortness of breath and snoring.   Endocrine: Negative for heat intolerance and polyphagia.  Hematologic/Lymphatic: Negative for bleeding problem. Does not bruise/bleed easily.  Skin: Negative for flushing, nail changes, rash and suspicious lesions.  Musculoskeletal: Negative for arthritis, joint pain, muscle cramps, myalgias, neck pain and stiffness.  Gastrointestinal: Negative for abdominal pain, bowel incontinence, diarrhea and excessive appetite.  Genitourinary: Negative for decreased libido, genital sores and incomplete emptying.  Neurological: Negative for brief paralysis, focal weakness, headaches and loss of balance.  Psychiatric/Behavioral: Negative for altered mental status, depression and suicidal ideas.  Allergic/Immunologic: Negative for HIV exposure and persistent infections.     EKGs/Labs/Other Studies Reviewed:    The following studies were reviewed today:   EKG: None today  ZIO monitor on May 06, 2019: The patient wore the monitor for 13 days, 20 hours.  Indication: Palpitations Minimum HR of 43 bpm, maximum HR of 226 bpm, and avg HR of 70 bpm.  Predominant underlying rhythm was Sinus Rhythm.  1 run of Ventricular Tachycardia occurred lasting 13 beats with a max rate of 226bpm (avg 198 bpm).  A total of 6 Supraventricular Tachycardia runs occurred, the run with the fastest interval lasting 19 beats with a max rate of 167 bpm, the longest lasting 16 beats with an avg rate of 104 bpm.  Premature atrial complexes were rare (<1.0%). Premature ventricular complexes were rare (<1.0%). Ventricular Triplets were present. No AV block, No pauses and no atrial fibrillation.  Conclusion:  This study is remarkable for a 13-beats run of ventricular tachycardia and 6 runs of supraventricular tachycardia which is likely atrial tachycardia.    Transthoracic echocardiogram IMPRESSIONS 04/08/2019.  1. Left ventricular ejection fraction, by visual estimation, is 60 to 65%. The left ventricle has normal function. Normal left ventricular size. There is borderline left ventricular hypertrophy.  2. Global right ventricle has normal systolic function.The right ventricular size is normal. No increase in right ventricular wall thickness.  3. Left atrial size was normal.  4. Right atrial size was normal.  5. The mitral valve is normal in structure. No evidence of mitral valve regurgitation. No evidence of mitral stenosis.  6. The tricuspid valve is normal in structure. Tricuspid valve regurgitation is trivial.  7. The aortic valve is normal in structure. Aortic valve regurgitation was not visualized by color flow Doppler. Structurally normal aortic valve, with no evidence of sclerosis or stenosis.  8. The pulmonic valve was normal in structure. Pulmonic valve regurgitation is not  visualized by color flow Doppler.  9. Normal pulmonary artery systolic pressure. 10. The inferior vena cava is normal in size with greater than 50% respiratory variability, suggesting right atrial pressure of 3 mmHg.  CTA coronary IMPRESSION:  1. Coronary calcium score of 0. This was 0 percentile for age and sex matched control. 2. Normal coronary origin with right dominance. 3. No evidence of calcified plaque. Motion artifact in the mid RCA and mid to distal LAD with poorly dilated vessels limits full assessment for noncalcified plaque.   Recent Labs: No results found for requested labs within last 8760 hours.  Recent Lipid Panel No results found for: CHOL, TRIG, HDL, CHOLHDL, VLDL, LDLCALC, LDLDIRECT  Physical Exam:    VS:  BP 122/88 (BP Location: Left Arm, Patient Position: Sitting, Cuff Size: Normal)   Pulse 79   Ht  (1.651 m)   Wt 206 lb (93.4 kg)   SpO2 98%   BMI 34.28 kg/m     Wt Readings  from Last 3 Encounters:  06/03/19 206 lb (93.4 kg)  04/05/19 207 lb 12.8 oz (94.3 kg)  06/21/15 177 lb (80.3 kg)     GEN: Well nourished, well developed in no acute distress HEENT: Normal NECK: No JVD; No carotid bruits LYMPHATICS: No lymphadenopathy CARDIAC: S1S2 noted,RRR, no murmurs, rubs, gallops RESPIRATORY:  Clear to auscultation without rales, wheezing or rhonchi  ABDOMEN: Soft, non-tender, non-distended, +bowel sounds, no guarding. EXTREMITIES: No edema, No cyanosis, no clubbing MUSCULOSKELETAL:  No edema; No deformity  SKIN: Warm and dry NEUROLOGIC:  Alert and oriented x 3, non-focal PSYCHIATRIC:  Normal affect, good insight  ASSESSMENT:    1. NSVT (nonsustained ventricular tachycardia) (HCC)   2. [name] (paroxysmal atrial tachycardia) (HCC)    PLAN:    1.  Nonsustained ventricular tachycardia-the patient monitor shows evidence of 13 beats of NSVT.   She has a CTA coronaries which showed some motion artifacts. For now, I will start patient on low-dose  beta-blocker Toprol-XL 12.5 mg daily.  Have educated patient about this new medication.  I asked her to call the office if she experience any unwanted effects.  2.  Paroxysmal atrial tachycardia-starting beta-blocker as stated above.  3. Since no recurrent chest pain for now, I will monitor the patient clinically. If she start to experience any further symptoms of chest pain we will pursue further testing.  The patient is in agreement with the above plan. The patient left the office in stable condition.  The patient will follow up in 3 months.   Medication Adjustments/Labs and Tests Ordered: Current medicines are reviewed at length with the patient today.  Concerns regarding medicines are outlined above.  Orders Placed This Encounter  Procedures  . Magnesium   Meds ordered this encounter  Medications  . metoprolol succinate (TOPROL XL) 25 MG 24 hr tablet    Sig: Take 0.5 tablets (12.5 mg total) by mouth daily.    Dispense:  45 tablet    Refill:  0    Patient Instructions  Medication Instructions:  Your physician has recommended you make the following change in your medication:   START: TOPROL XL 12.5 MG DAILY  *If you need a refill on your cardiac medications before your next appointment, please call your pharmacy*  Lab Work: Your physician recommends that you return for lab work in: TODAY  MAGNESIUM  If you have labs (blood work) drawn today and your tests are completely normal, you will receive your results only by: Marland Kitchen MyChart Message (if you have MyChart) OR . A paper copy in the mail If you have any lab test that is abnormal or we need to change your treatment, we will call you to review the results.  Testing/Procedures: None Ordered  Follow-Up: At [facility], you and your health needs are our priority.  As part of our continuing mission to provide you with exceptional heart care, we have created designated Provider Care Teams.  These Care Teams include your  primary Cardiologist (physician) and Advanced Practice Providers (APPs -  Physician Assistants and Nurse Practitioners) who all work together to provide you with the care you need, when you need it.  Your next appointment:   3 months  The format for your next appointment:   In Person  Provider:   You may see Thomasene Ripple, DO  or the following Advanced Practice Provider on your designated Care Team:    Gillian Shields, FNP         Adopting a Healthy Lifestyle.  Know what a healthy weight is for you (roughly BMI <25) and aim to maintain this   Aim for 7+ servings of fruits and vegetables daily   65-80+ fluid ounces of water or unsweet tea for healthy kidneys   Limit to max 1 drink of alcohol per day; avoid smoking/tobacco   Limit animal fats in diet for cholesterol and heart health - choose grass fed whenever available   Avoid highly processed foods, and foods high in saturated/trans fats   Aim for low stress - take time to unwind and care for your mental health   Aim for 150 min of moderate intensity exercise weekly for heart health, and weights twice weekly for bone health   Aim for 7-9 hours of sleep daily   When it comes to diets, agreement about the perfect plan isnt easy to find, even among the experts. Experts at the [facility] developed an idea known as the Healthy Eating Plate. Just imagine a plate divided into logical, healthy portions.   The emphasis is on diet quality:   Load up on vegetables and fruits - one-half of your plate: Aim for color and variety, and remember that potatoes dont count.   Go for whole grains - one-quarter of your plate: Whole wheat, barley, wheat berries, quinoa, oats, brown rice, and foods made with them. If you want pasta, go with whole wheat pasta.   Protein power - one-quarter of your plate: Fish, chicken, beans, and nuts are all healthy, versatile protein sources. Limit red meat.   The diet, however, does go  beyond the plate, offering a few other suggestions.   Use healthy plant oils, such as olive, canola, soy, corn, sunflower and peanut. Check the labels, and avoid partially hydrogenated oil, which have unhealthy trans fats.   If youre thirsty, drink water. Coffee and tea are good in moderation, but skip sugary drinks and limit milk and dairy products to one or two daily servings.   The type of carbohydrate in the diet is more important than the amount. Some sources of carbohydrates, such as vegetables, fruits, whole grains, and beans-are healthier than others.   Finally, stay active  Signed, Berniece Salines, DO  06/03/2019 10:25 AM    [place]

## 2019-06-04 LAB — MAGNESIUM: Magnesium: 2 mg/dL (ref 1.6–2.3)

## 2019-06-25 ENCOUNTER — Other Ambulatory Visit: Payer: Self-pay | Admitting: Cardiology

## 2019-06-28 NOTE — Telephone Encounter (Signed)
Metoprolol refill sent to [facility].

## 2019-08-23 ENCOUNTER — Other Ambulatory Visit: Payer: Self-pay

## 2019-08-23 ENCOUNTER — Encounter: Payer: Self-pay | Admitting: Cardiology

## 2019-08-23 ENCOUNTER — Ambulatory Visit (INDEPENDENT_AMBULATORY_CARE_PROVIDER_SITE_OTHER): Payer: 59 | Admitting: Cardiology

## 2019-08-23 VITALS — BP 102/78 | HR 72 | Ht 65.0 in | Wt 198.0 lb

## 2019-08-23 DIAGNOSIS — I4729 Other ventricular tachycardia: Secondary | ICD-10-CM

## 2019-08-23 DIAGNOSIS — I471 Supraventricular tachycardia: Secondary | ICD-10-CM | POA: Diagnosis not present

## 2019-08-23 DIAGNOSIS — I472 Ventricular tachycardia: Secondary | ICD-10-CM | POA: Diagnosis not present

## 2019-08-23 NOTE — Patient Instructions (Signed)
Medication Instructions:  Your physician recommends that you continue on your current medications as directed. Please refer to the Current Medication list given to you today.  *If you need a refill on your cardiac medications before your next appointment, please call your pharmacy*  Lab Work: None If you have labs (blood work) drawn today and your tests are completely normal, you will receive your results only by: Marland Kitchen MyChart Message (if you have MyChart) OR . A paper copy in the mail If you have any lab test that is abnormal or we need to change your treatment, we will call you to review the results.  Testing/Procedures: nOne  Follow-Up: At [facility], you and your health needs are our priority.  As part of our continuing mission to provide you with exceptional heart care, we have created designated Provider Care Teams.  These Care Teams include your primary Cardiologist (physician) and Advanced Practice Providers (APPs -  Physician Assistants and Nurse Practitioners) who all work together to provide you with the care you need, when you need it.  Your next appointment:   6 month(s)  The format for your next appointment:   In Person  Provider:   Thomasene Ripple, DO  Other Instructions

## 2019-08-23 NOTE — Progress Notes (Signed)
Cardiology Office Note:    Date:  08/23/2019   ID:  Becky Wang [facility], DOB [birth year], MRN [id number]  PCP:  Patient, [name] Per  Cardiologist:  Thomasene Ripple, DO  Electrophysiologist:  None   Referring MD: No ref. provider found   Chief Complaint  Patient presents with  . Follow-up    History of Present Illness:    Becky Wang is a 54 y.o. female with history of palpitations, chest pain, family history of premature coronary artery disease was initially seen on April 05, 2019 at that time was recommended that she undergo coronary CTA, transthoracic echocardiogram and wear a ZIO monitor.  The patient follow-up on June 03, 2019 to discuss the results of her testing.  Her echocardiogram was normal.  However her ZIO monitor showed evidence of paroxysmal atrial tachycardia in 6 beat run of nonsustained ventricular tachycardia.  She did undergo coronary CTA which reported 0 calcium.  Due to the palpitations and the evidence of paroxysmal atrial tachycardia and nonsustained ventricular tachycardia patient was started on low-dose beta-blocker.    The patient is here she is here today for follow-up visit she tells me that she has some improvement on the metoprolol.  He notes that she is pretty sure she had COVID-19 fraction around Thanksgiving she self isolated and is now doing better and is recovering from the loss of smell and taste. No other complaints at this time.  Past Medical History:  Diagnosis Date  . Heart murmur   . Vitamin D deficiency     Past Surgical History:  Procedure Laterality Date  . CHOLECYSTECTOMY    . WRIST SURGERY      Current Medications: Current Meds  Medication Sig  . meloxicam (MOBIC) 15 MG tablet Take 15 mg by mouth daily as needed.  . metoprolol succinate (TOPROL-XL) 25 MG 24 hr tablet TAKE 1/2 TABLET BY MOUTH EVERY DAY  . Multiple Vitamins-Minerals (MULTIVITAMIN WITH MINERALS) tablet Take 1 tablet by mouth daily.  Marland Kitchen oxybutynin (DITROPAN XL)  15 MG 24 hr tablet Take 15 mg by mouth daily.     Allergies:   Patient has no known allergies.   Social History   Socioeconomic History  . Marital status: Divorced    Spouse name: Not on file  . Number of children: Not on file  . Years of education: Not on file  . Highest education level: Not on file  Occupational History  . Not on file  Tobacco Use  . Smoking status: Never Smoker  . Smokeless tobacco: Never Used  Substance and Sexual Activity  . Alcohol use: Yes    Comment: occ  . Drug use: No  . Sexual activity: Yes    Birth control/protection: I.U.D.  Other Topics Concern  . Not on file  Social History Narrative  . Not on file   Social Determinants of Health   Financial Resource Strain:   . Difficulty of Paying Living Expenses: Not on file  Food Insecurity:   . Worried About Programme researcher, broadcasting/film/video in the Last Year: Not on file  . Ran Out of Food in the Last Year: Not on file  Transportation Needs:   . Lack of Transportation (Medical): Not on file  . Lack of Transportation (Non-Medical): Not on file  Physical Activity:   . Days of Exercise per Week: Not on file  . Minutes of Exercise per Session: Not on file  Stress:   . Feeling of Stress : Not on file  Social  Connections:   . Frequency of Communication with Friends and Family: Not on file  . Frequency of Social Gatherings with Friends and Family: Not on file  . Attends Religious Services: Not on file  . Active Member of Clubs or Organizations: Not on file  . Attends Banker Meetings: Not on file  . Marital Status: Not on file     Family History: The patient's family history includes Congestive Heart Failure in an other family member; Fibromyalgia in her sister; Heart attack in her father; Hypertension in her mother; Other in her sister.  ROS:   Review of Systems  Constitution: Negative for decreased appetite, fever and weight gain.  HENT: Negative for congestion, ear discharge, hoarse voice and  sore throat.   Eyes: Negative for discharge, redness, vision loss in right eye and visual halos.  Cardiovascular: Negative for chest pain, dyspnea on exertion, leg swelling, orthopnea and palpitations.  Respiratory: Negative for cough, hemoptysis, shortness of breath and snoring.   Endocrine: Negative for heat intolerance and polyphagia.  Hematologic/Lymphatic: Negative for bleeding problem. Does not bruise/bleed easily.  Skin: Negative for flushing, nail changes, rash and suspicious lesions.  Musculoskeletal: Negative for arthritis, joint pain, muscle cramps, myalgias, neck pain and stiffness.  Gastrointestinal: Negative for abdominal pain, bowel incontinence, diarrhea and excessive appetite.  Genitourinary: Negative for decreased libido, genital sores and incomplete emptying.  Neurological: Negative for brief paralysis, focal weakness, headaches and loss of balance.  Psychiatric/Behavioral: Negative for altered mental status, depression and suicidal ideas.  Allergic/Immunologic: Negative for HIV exposure and persistent infections.    EKGs/Labs/Other Studies Reviewed:    The following studies were reviewed today:   EKG: None today  ZIO monitor on May 06, 2019: The patient wore the monitor for 13 days, 20 hours.  Indication: Palpitations Minimum HR of 43 bpm, maximum HR of 226 bpm, and avg HR of 70 bpm.  Predominant underlying rhythm was Sinus Rhythm.  1 run of Ventricular Tachycardia occurred lasting 13 beats with a max rate of 226bpm (avg 198 bpm).  A total of 6 Supraventricular Tachycardia runs occurred, the run with the fastest interval lasting 19 beats with a max rate of 167 bpm, the longest lasting 16 beats with an avg rate of 104 bpm.  Premature atrial complexes were rare (<1.0%). Premature ventricular complexes were rare (<1.0%). Ventricular Triplets were present. No AV block, No pauses and no atrial fibrillation.  Conclusion: This study is remarkable for a 13-beats run  of ventricular tachycardia and 6 runs of supraventricular tachycardia which is likely atrial tachycardia.   Transthoracic echocardiogram IMPRESSIONS 04/08/2019. 1. Left ventricular ejection fraction, by visual estimation, is 60 to 65%. The left ventricle has normal function. Normal left ventricular size. There is borderline left ventricular hypertrophy. 2. Global right ventricle has normal systolic function.The right ventricular size is normal. No increase in right ventricular wall thickness. 3. Left atrial size was normal. 4. Right atrial size was normal. 5. The mitral valve is normal in structure. No evidence of mitral valve regurgitation. No evidence of mitral stenosis. 6. The tricuspid valve is normal in structure. Tricuspid valve regurgitation is trivial. 7. The aortic valve is normal in structure. Aortic valve regurgitation was not visualized by color flow Doppler. Structurally normal aortic valve, with no evidence of sclerosis or stenosis. 8. The pulmonic valve was normal in structure. Pulmonic valve regurgitation is not visualized by color flow Doppler. 9. Normal pulmonary artery systolic pressure. 10. The inferior vena cava is normal in size with  greater than 50% respiratory variability, suggesting right atrial pressure of 3 mmHg.  CTA coronary IMPRESSION:  1. Coronary calcium score of 0. This was 0 percentile for age and sex matched control. 2. Normal coronary origin with right dominance. 3. No evidence of calcified plaque. Motion artifact in the mid RCA and mid to distal LAD with poorly dilated vessels limits full assessment for noncalcified plaque.   Recent Labs: 06/03/2019: Magnesium 2.0  Recent Lipid Panel No results found for: CHOL, TRIG, HDL, CHOLHDL, VLDL, LDLCALC, LDLDIRECT  Physical Exam:    VS:  BP 102/78 (BP Location: Left Arm, Patient Position: Sitting, Cuff Size: Normal)   Pulse 72   Ht 5\' 5"  (1.651 m)   Wt 198 lb (89.8 kg)   SpO2 95%   BMI 32.95  kg/m     Wt Readings from Last 3 Encounters:  08/23/19 198 lb (89.8 kg)  06/03/19 206 lb (93.4 kg)  04/05/19 207 lb 12.8 oz (94.3 kg)     GEN: Well nourished, well developed in no acute distress HEENT: Normal NECK: No JVD; No carotid bruits LYMPHATICS: No lymphadenopathy CARDIAC: S1S2 noted,RRR, no murmurs, rubs, gallops RESPIRATORY:  Clear to auscultation without rales, wheezing or rhonchi  ABDOMEN: Soft, non-tender, non-distended, +bowel sounds, no guarding. EXTREMITIES: No edema, No cyanosis, no clubbing MUSCULOSKELETAL:  No deformity  SKIN: Warm and dry NEUROLOGIC:  Alert and oriented x 3, non-focal PSYCHIATRIC:  Normal affect, good insight  ASSESSMENT:    1. PAT (paroxysmal atrial tachycardia) ([place])   2. NSVT (nonsustained ventricular tachycardia) ([facility])    PLAN:     Thankfully she is have some improvement on her Toprol XL 12.5 mg daily.  I had a discussion with the patient.  Should she ever experience worsening palpitations like she did prior to starting Toprol-XL.  I have advised her to take her blood pressure as long as is greater than 211 systolic, it will be okay to take a another 12.5 mg Toprol-XL.  She expresses understanding and she offers no questions at this time.  The patient is in agreement with the above plan. The patient left the office in stable condition.  The patient will follow up in next months or sooner if needed.   Medication Adjustments/Labs and Tests Ordered: Current medicines are reviewed at length with the patient today.  Concerns regarding medicines are outlined above.  No orders of the defined types were placed in this encounter.  No orders of the defined types were placed in this encounter.   Patient Instructions  Medication Instructions:  Your physician recommends that you continue on your current medications as directed. Please refer to the Current Medication list given to you today.  *If you need a refill on your cardiac medications  before your next appointment, please call your pharmacy*  Lab Work: None If you have labs (blood work) drawn today and your tests are completely normal, you will receive your results only by: Marland Kitchen MyChart Message (if you have MyChart) OR . A paper copy in the mail If you have any lab test that is abnormal or we need to change your treatment, we will call you to review the results.  Testing/Procedures: nOne  Follow-Up: At [facility], you and your health needs are our priority.  As part of our continuing mission to provide you with exceptional heart care, we have created designated Provider Care Teams.  These Care Teams include your primary Cardiologist (physician) and Advanced Practice Providers (APPs -  Physician Assistants and Nurse  Practitioners) who all work together to provide you with the care you need, when you need it.  Your next appointment:   6 month(s)  The format for your next appointment:   In Person  Provider:   Thomasene Ripple, DO  Other Instructions      Adopting a Healthy Lifestyle.  Know what a healthy weight is for you (roughly BMI <25) and aim to maintain this   Aim for 7+ servings of fruits and vegetables daily   65-80+ fluid ounces of water or unsweet tea for healthy kidneys   Limit to max 1 drink of alcohol per day; avoid smoking/tobacco   Limit animal fats in diet for cholesterol and heart health - choose grass fed whenever available   Avoid highly processed foods, and foods high in saturated/trans fats   Aim for low stress - take time to unwind and care for your mental health   Aim for 150 min of moderate intensity exercise weekly for heart health, and weights twice weekly for bone health   Aim for 7-9 hours of sleep daily   When it comes to diets, agreement about the perfect plan isnt easy to find, even among the experts. Experts at the [facility] of [employer] developed an idea known as the Healthy Eating Plate. Just imagine a plate  divided into logical, healthy portions.   The emphasis is on diet quality:   Load up on vegetables and fruits - one-half of your plate: Aim for color and variety, and remember that potatoes dont count.   Go for whole grains - one-quarter of your plate: Whole wheat, barley, wheat berries, quinoa, oats, brown rice, and foods made with them. If you want pasta, go with whole wheat pasta.   Protein power - one-quarter of your plate: Fish, chicken, beans, and nuts are all healthy, versatile protein sources. Limit red meat.   The diet, however, does go beyond the plate, offering a few other suggestions.   Use healthy plant oils, such as olive, canola, soy, corn, sunflower and peanut. Check the labels, and avoid partially hydrogenated oil, which have unhealthy trans fats.   If youre thirsty, drink water. Coffee and tea are good in moderation, but skip sugary drinks and limit milk and dairy products to one or two daily servings.   The type of carbohydrate in the diet is more important than the amount. Some sources of carbohydrates, such as vegetables, fruits, whole grains, and beans-are healthier than others.   Finally, stay active  Signed, Thomasene Ripple, DO  08/23/2019 9:38 AM    [facility]

## 2019-08-26 ENCOUNTER — Ambulatory Visit: Payer: 59 | Admitting: Cardiology

## 2019-10-15 ENCOUNTER — Other Ambulatory Visit: Payer: Self-pay | Admitting: Cardiology

## 2019-10-15 MED ORDER — METOPROLOL SUCCINATE ER 25 MG PO TB24: 12.5000 mg | ORAL_TABLET | Freq: Every day | ORAL | 2 refills | Status: DC

## 2019-10-15 NOTE — Telephone Encounter (Signed)
Medication refilled

## 2019-10-15 NOTE — Telephone Encounter (Signed)
. *  STAT* If patient is at the pharmacy, call can be transferred to refill team.   1. Which medications need to be refilled? (please list name of each medication and dose if known) metoprolol succinate (TOPROL-XL) 25 MG 24 hr tablet  2. Which pharmacy/location (including street and city if local pharmacy) is medication to be sent to? [id number] - [place], [place] - [address] AT [address]  3. Do they need a 30 day or 90 day supply? 30 days

## 2019-11-16 ENCOUNTER — Other Ambulatory Visit: Payer: Self-pay

## 2019-11-16 MED ORDER — METOPROLOL SUCCINATE ER 25 MG PO TB24: 12.5000 mg | ORAL_TABLET | Freq: Every day | ORAL | 3 refills | Status: DC

## 2020-03-31 ENCOUNTER — Encounter: Payer: Self-pay | Admitting: Cardiology

## 2020-03-31 ENCOUNTER — Other Ambulatory Visit: Payer: Self-pay

## 2020-03-31 ENCOUNTER — Ambulatory Visit: Payer: 59 | Admitting: Cardiology

## 2020-03-31 VITALS — BP 115/75 | HR 64 | Ht 65.0 in | Wt 197.0 lb

## 2020-03-31 DIAGNOSIS — I471 Supraventricular tachycardia: Secondary | ICD-10-CM

## 2020-03-31 DIAGNOSIS — I472 Ventricular tachycardia: Secondary | ICD-10-CM

## 2020-03-31 DIAGNOSIS — E669 Obesity, unspecified: Secondary | ICD-10-CM | POA: Diagnosis not present

## 2020-03-31 DIAGNOSIS — I4719 Other supraventricular tachycardia: Secondary | ICD-10-CM

## 2020-03-31 DIAGNOSIS — I4729 Other ventricular tachycardia: Secondary | ICD-10-CM

## 2020-03-31 NOTE — Patient Instructions (Addendum)
Medication Instructions:  Your physician recommends that you continue on your current medications as directed. Please refer to the Current Medication list given to you today.  *If you need a refill on your cardiac medications before your next appointment, please call your pharmacy*   Lab Work: None ordered  If you have labs (blood work) drawn today and your tests are completely normal, you will receive your results only by: . MyChart Message (if you have MyChart) OR . A paper copy in the mail If you have any lab test that is abnormal or we need to change your treatment, we will call you to review the results.   Testing/Procedures: None ordered   Follow-Up: At [facility], you and your health needs are our priority.  As part of our continuing mission to provide you with exceptional heart care, we have created designated Provider Care Teams.  These Care Teams include your primary Cardiologist (physician) and Advanced Practice Providers (APPs -  Physician Assistants and Nurse Practitioners) who all work together to provide you with the care you need, when you need it.  We recommend signing up for the patient portal called "MyChart".  Sign up information is provided on this After Visit Summary.  MyChart is used to connect with patients for Virtual Visits (Telemedicine).  Patients are able to view lab/test results, encounter notes, upcoming appointments, etc.  Non-urgent messages can be sent to your provider as well.   To learn more about what you can do with MyChart, go to [email or web address].    Your next appointment:   12 month(s)  The format for your next appointment:   In Person  Provider:   Kardie Tobb, DO   Other Instructions  

## 2020-03-31 NOTE — Progress Notes (Signed)
Cardiology Office Note:    Date:  03/31/2020   ID:  [name], DOB April 30, 1966, MRN [id number]  PCP:  Patient, [name] Per  Cardiologist:  Thomasene Ripple, DO  Electrophysiologist:  None   Referring MD: No ref. provider found   " I am doing fine"  History of Present Illness:    Becky Wang is a 54 y.o. female with a hx of paroxysmal atrial tachycardia on metoprolol, 13 beats of NSVT on metoprolol,  systolic murmur with normal echocardiogram presents today for follow-up visit.  Did see the patient in February 2021 at that time she appeared to be doing better on her metoprolol.  Her palpitations have resolved.  No changes were made to her antihypertensive regimen I did recommend to see the patient in 6 months. Today she is here for follow-up visit.  She tells me she is doing well from a cardiovascular standpoint.  Her palpitations has improved she does have some episodes but not as it was before she is happy on the dose of metoprolol succinate.  She denies any chest pain shortness of breath, lightheadedness or dizziness.  Past Medical History:  Diagnosis Date  . Heart murmur   . Vitamin D deficiency     Past Surgical History:  Procedure Laterality Date  . CHOLECYSTECTOMY    . WRIST SURGERY      Current Medications: Current Meds  Medication Sig  . metoprolol succinate (TOPROL-XL) 25 MG 24 hr tablet Take 0.5 tablets (12.5 mg total) by mouth daily.  . Multiple Vitamins-Minerals (MULTIVITAMIN WITH MINERALS) tablet Take 1 tablet by mouth daily.  Marland Kitchen oxybutynin (DITROPAN XL) 15 MG 24 hr tablet Take 15 mg by mouth daily.     Allergies:   Patient has no known allergies.   Social History   Socioeconomic History  . Marital status: Divorced    Spouse name: Not on file  . Number of children: Not on file  . Years of education: Not on file  . Highest education level: Not on file  Occupational History  . Not on file  Tobacco Use  . Smoking status: Never Smoker  . Smokeless  tobacco: Never Used  Vaping Use  . Vaping Use: Never used  Substance and Sexual Activity  . Alcohol use: Yes    Comment: occ  . Drug use: No  . Sexual activity: Yes    Birth control/protection: I.U.D.  Other Topics Concern  . Not on file  Social History Narrative  . Not on file   Social Determinants of Health   Financial Resource Strain:   . Difficulty of Paying Living Expenses: Not on file  Food Insecurity:   . Worried About Programme researcher, broadcasting/film/video in the Last Year: Not on file  . Ran Out of Food in the Last Year: Not on file  Transportation Needs:   . Lack of Transportation (Medical): Not on file  . Lack of Transportation (Non-Medical): Not on file  Physical Activity:   . Days of Exercise per Week: Not on file  . Minutes of Exercise per Session: Not on file  Stress:   . Feeling of Stress : Not on file  Social Connections:   . Frequency of Communication with Friends and Family: Not on file  . Frequency of Social Gatherings with Friends and Family: Not on file  . Attends Religious Services: Not on file  . Active Member of Clubs or Organizations: Not on file  . Attends Banker Meetings: Not on  file  . Marital Status: Not on file     Family History: The patient's family history includes Congestive Heart Failure in an other family member; Fibromyalgia in her sister; Heart attack in her father; Hypertension in her mother; Other in her sister.  ROS:   Review of Systems  Constitution: Negative for decreased appetite, fever and weight gain.  HENT: Negative for congestion, ear discharge, hoarse voice and sore throat.   Eyes: Negative for discharge, redness, vision loss in right eye and visual halos.  Cardiovascular: Negative for chest pain, dyspnea on exertion, leg swelling, orthopnea and palpitations.  Respiratory: Negative for cough, hemoptysis, shortness of breath and snoring.   Endocrine: Negative for heat intolerance and polyphagia.  Hematologic/Lymphatic:  Negative for bleeding problem. Does not bruise/bleed easily.  Skin: Negative for flushing, nail changes, rash and suspicious lesions.  Musculoskeletal: Negative for arthritis, joint pain, muscle cramps, myalgias, neck pain and stiffness.  Gastrointestinal: Negative for abdominal pain, bowel incontinence, diarrhea and excessive appetite.  Genitourinary: Negative for decreased libido, genital sores and incomplete emptying.  Neurological: Negative for brief paralysis, focal weakness, headaches and loss of balance.  Psychiatric/Behavioral: Negative for altered mental status, depression and suicidal ideas.  Allergic/Immunologic: Negative for HIV exposure and persistent infections.    EKGs/Labs/Other Studies Reviewed:    The following studies were reviewed today:   EKG:  None today  ZIO monitor on May 06, 2019: The patient wore the monitor for 13 days, 20 hours.  Indication: Palpitations Minimum HR of 43 bpm, maximum HR of 226 bpm, and avg HR of 70 bpm.  Predominant underlying rhythm was Sinus Rhythm.  1 run of Ventricular Tachycardia occurred lasting 13 beats with a max rate of 226bpm (avg 198 bpm).  A total of 6 Supraventricular Tachycardia runs occurred, the run with the fastest interval lasting 19 beats with a max rate of 167 bpm, the longest lasting 16 beats with an avg rate of 104 bpm.  Premature atrial complexes were rare (<1.0%). Premature ventricular complexes were rare (<1.0%). Ventricular Triplets were present. No AV block, No pauses and no atrial fibrillation.  Conclusion: This study is remarkable for a 13-beats run of ventricular tachycardia and 6 runs of supraventricular tachycardia which is likely atrial tachycardia.   Transthoracic echocardiogram IMPRESSIONS 04/08/2019. 1. Left ventricular ejection fraction, by visual estimation, is 60 to 65%. The left ventricle has normal function. Normal left ventricular size. There is borderline left ventricular hypertrophy. 2.  Global right ventricle has normal systolic function.The right ventricular size is normal. No increase in right ventricular wall thickness. 3. Left atrial size was normal. 4. Right atrial size was normal. 5. The mitral valve is normal in structure. No evidence of mitral valve regurgitation. No evidence of mitral stenosis. 6. The tricuspid valve is normal in structure. Tricuspid valve regurgitation is trivial. 7. The aortic valve is normal in structure. Aortic valve regurgitation was not visualized by color flow Doppler. Structurally normal aortic valve, with no evidence of sclerosis or stenosis. 8. The pulmonic valve was normal in structure. Pulmonic valve regurgitation is not visualized by color flow Doppler. 9. Normal pulmonary artery systolic pressure. 10. The inferior vena cava is normal in size with greater than 50% respiratory variability, suggesting right atrial pressure of 3 mmHg.  CTA coronary IMPRESSION:  1. Coronary calcium score of 0. This was 0 percentile for age and sex matched control. 2. Normal coronary origin with right dominance. 3. No evidence of calcified plaque. Motion artifact in the mid RCA and mid  to distal LAD with poorly dilated vessels limits full assessment for noncalcified plaque.   Recent Labs: 06/03/2019: Magnesium 2.0  Recent Lipid Panel No results found for: CHOL, TRIG, HDL, CHOLHDL, VLDL, LDLCALC, LDLDIRECT  Physical Exam:    VS:  BP 115/75   Pulse 64   Ht 5\' 5"  (1.651 m)   Wt 197 lb (89.4 kg)   SpO2 94%   BMI 32.78 kg/m     Wt Readings from Last 3 Encounters:  03/31/20 197 lb (89.4 kg)  08/23/19 198 lb (89.8 kg)  06/03/19 206 lb (93.4 kg)     GEN: Well nourished, well developed in no acute distress HEENT: Normal NECK: No JVD; No carotid bruits LYMPHATICS: No lymphadenopathy CARDIAC: S1S2 noted,RRR, no murmurs, rubs, gallops RESPIRATORY:  Clear to auscultation without rales, wheezing or rhonchi  ABDOMEN: Soft, non-tender,  non-distended, +bowel sounds, no guarding. EXTREMITIES: No edema, No cyanosis, no clubbing MUSCULOSKELETAL:  No deformity  SKIN: Warm and dry NEUROLOGIC:  Alert and oriented x 3, non-focal PSYCHIATRIC:  Normal affect, good insight  ASSESSMENT:    1. NSVT (nonsustained ventricular tachycardia) (HCC)   2. PAT (paroxysmal atrial tachycardia) (HCC)   3. Obesity (BMI 30-39.9)    PLAN:     1.  She is doing well from a cardiovascular standpoint.  She is happy on her metoprolol succinate 12.5 mg daily her palpitations I feel is at baseline.  We will keep her on this medication regimen for now.  2.  The patient understands the need to lose weight with diet and exercise. We have discussed specific strategies for this.  I also encouraged patient that she will need a PCP.  Currently she uses her OB/GYN as her PCP.  She expresses that she is planning on getting a PCP.  The patient is in agreement with the above plan. The patient left the office in stable condition.  The patient will follow up in   Medication Adjustments/Labs and Tests Ordered: Current medicines are reviewed at length with the patient today.  Concerns regarding medicines are outlined above.  No orders of the defined types were placed in this encounter.  No orders of the defined types were placed in this encounter.   Patient Instructions  Medication Instructions:  Your physician recommends that you continue on your current medications as directed. Please refer to the Current Medication list given to you today.  *If you need a refill on your cardiac medications before your next appointment, please call your pharmacy*   Lab Work: None ordered  If you have labs (blood work) drawn today and your tests are completely normal, you will receive your results only by: Marland Kitchen. MyChart Message (if you have MyChart) OR . A paper copy in the mail If you have any lab test that is abnormal or we need to change your treatment, we will call  you to review the results.   Testing/Procedures: None ordered   Follow-Up: At [facility], you and your health needs are our priority.  As part of our continuing mission to provide you with exceptional heart care, we have created designated Provider Care Teams.  These Care Teams include your primary Cardiologist (physician) and Advanced Practice Providers (APPs -  Physician Assistants and Nurse Practitioners) who all work together to provide you with the care you need, when you need it.  We recommend signing up for the patient portal called "MyChart".  Sign up information is provided on this After Visit Summary.  MyChart is used to  connect with patients for Virtual Visits (Telemedicine).  Patients are able to view lab/test results, encounter notes, upcoming appointments, etc.  Non-urgent messages can be sent to your provider as well.   To learn more about what you can do with MyChart, go to [email or web address].    Your next appointment:   12 month(s)  The format for your next appointment:   In Person  Provider:   Thomasene Ripple, DO   Other Instructions      Adopting a Healthy Lifestyle.  Know what a healthy weight is for you (roughly BMI <25) and aim to maintain this   Aim for 7+ servings of fruits and vegetables daily   65-80+ fluid ounces of water or unsweet tea for healthy kidneys   Limit to max 1 drink of alcohol per day; avoid smoking/tobacco   Limit animal fats in diet for cholesterol and heart health - choose grass fed whenever available   Avoid highly processed foods, and foods high in saturated/trans fats   Aim for low stress - take time to unwind and care for your mental health   Aim for 150 min of moderate intensity exercise weekly for heart health, and weights twice weekly for bone health   Aim for 7-9 hours of sleep daily   When it comes to diets, agreement about the perfect plan isnt easy to find, even among the experts. Experts at the [facility] [employer] developed an idea known as the Healthy Eating Plate. Just imagine a plate divided into logical, healthy portions.   The emphasis is on diet quality:   Load up on vegetables and fruits - one-half of your plate: Aim for color and variety, and remember that potatoes dont count.   Go for whole grains - one-quarter of your plate: Whole wheat, barley, wheat berries, quinoa, oats, brown rice, and foods made with them. If you want pasta, go with whole wheat pasta.   Protein power - one-quarter of your plate: Fish, chicken, beans, and nuts are all healthy, versatile protein sources. Limit red meat.   The diet, however, does go beyond the plate, offering a few other suggestions.   Use healthy plant oils, such as olive, canola, soy, corn, sunflower and peanut. Check the labels, and avoid partially hydrogenated oil, which have unhealthy trans fats.   If youre thirsty, drink water. Coffee and tea are good in moderation, but skip sugary drinks and limit milk and dairy products to one or two daily servings.   The type of carbohydrate in the diet is more important than the amount. Some sources of carbohydrates, such as vegetables, fruits, whole grains, and beans-are healthier than others.   Finally, stay active  Signed, Thomasene Ripple, DO  03/31/2020 1:40 PM     [facility]

## 2020-08-31 ENCOUNTER — Other Ambulatory Visit: Payer: Self-pay | Admitting: Cardiology

## 2020-09-01 NOTE — Telephone Encounter (Signed)
Refill for Metoprolol Succinate ER 25 mg tablets sent to [facility]

## 2021-03-06 ENCOUNTER — Encounter (HOSPITAL_BASED_OUTPATIENT_CLINIC_OR_DEPARTMENT_OTHER): Payer: Self-pay

## 2021-03-06 ENCOUNTER — Emergency Department (HOSPITAL_BASED_OUTPATIENT_CLINIC_OR_DEPARTMENT_OTHER)
Admission: EM | Admit: 2021-03-06 | Discharge: 2021-03-06 | Disposition: A | Discharge status: Home / Self Care | Payer: 59 | Attending: Emergency Medicine | Admitting: Emergency Medicine

## 2021-03-06 ENCOUNTER — Other Ambulatory Visit: Payer: Self-pay

## 2021-03-06 DIAGNOSIS — M545 Low back pain, unspecified: Secondary | ICD-10-CM | POA: Diagnosis present

## 2021-03-06 MED ORDER — LIDOCAINE 5 % EX PTCH
2.0000 | MEDICATED_PATCH | CUTANEOUS | Status: DC
  Administered 2021-03-06: 2 via TRANSDERMAL
  Filled 2021-03-06: qty 2

## 2021-03-06 MED ORDER — METHOCARBAMOL 500 MG PO TABS: 500.0000 mg | ORAL_TABLET | Freq: Two times a day (BID) | ORAL | 0 refills | Status: DC

## 2021-03-06 MED ORDER — NAPROXEN 500 MG PO TABS: 500.0000 mg | ORAL_TABLET | Freq: Two times a day (BID) | ORAL | 0 refills | Status: DC

## 2021-03-06 NOTE — ED Provider Notes (Signed)
[facility] EMERGENCY DEPARTMENT Provider Note   CSN: [id number] Arrival date & time: 03/06/21  1223     History Chief Complaint  Patient presents with   Back Pain    Becky Wang is a 55 y.o. female with a past medical history significant for paroxysmal atrial tachycardia and vitamin D deficiency who presents to the ED due to low back pain x4 days.  Patient states she was walking on uneven terrain with wedge sandals and woke up the next morning with low back pain.  Patient states low back pain is located in the lumbar region and radiates from left to right.  Denies associate abdominal pain.  Denies saddle paresthesias, bowel/bladder incontinence, lower extremity numbness/tingling, lower extremity weakness, IV drug use, fever/chills, and history of cancer.  No direct trauma to low back.  Denies urinary symptoms.  Patient's been taking ibuprofen with moderate relief. History of low back pain.  Pain is worse with movement especially ambulation.  History obtained from patient and past medical records. No interpreter used during encounter.       Past Medical History:  Diagnosis Date   Heart murmur    Vitamin D deficiency     Patient Active Problem List   Diagnosis Date Noted   Obesity (BMI 30-39.9) 03/31/2020   NSVT (nonsustained ventricular tachycardia) (HCC) 06/03/2019   PAT (paroxysmal atrial tachycardia) (HCC) 06/03/2019    Past Surgical History:  Procedure Laterality Date   CHOLECYSTECTOMY     WRIST SURGERY       OB History   No obstetric history on file.     Family History  Problem Relation Age of Onset   Hypertension Mother    Heart attack Father        His MI was at age 45.   Fibromyalgia Sister    Other Sister    Congestive Heart Failure Other     Social History   Tobacco Use   Smoking status: Never   Smokeless tobacco: Never  Vaping Use   Vaping Use: Never used  Substance Use Topics   Alcohol use: Yes    Comment: occ   Drug use: No     Home Medications Prior to Admission medications   Medication Sig Start Date End Date Taking? Authorizing Provider  methocarbamol (ROBAXIN) 500 MG tablet Take 1 tablet (500 mg total) by mouth 2 (two) times daily. 03/06/21  Yes Radhika Dershem C, PA-C  naproxen (NAPROSYN) 500 MG tablet Take 1 tablet (500 mg total) by mouth 2 (two) times daily. 03/06/21  Yes Cartina Brousseau, Merla Riches, PA-C  meloxicam (MOBIC) 15 MG tablet Take 15 mg by mouth daily as needed. Patient not taking: Reported on 03/31/2020 10/15/18   [provider]  metoprolol succinate (TOPROL-XL) 25 MG 24 hr tablet TAKE ONE-HALF TABLET BY  MOUTH DAILY 09/01/20   Tobb, Kardie, DO  Multiple Vitamins-Minerals (MULTIVITAMIN WITH MINERALS) tablet Take 1 tablet by mouth daily.    [provider]  oxybutynin (DITROPAN XL) 15 MG 24 hr tablet Take 15 mg by mouth daily.    [provider]    Allergies    Patient has no known allergies.  Review of Systems   Review of Systems  Constitutional:  Negative for chills and fever.  Genitourinary:  Negative for dysuria.  Musculoskeletal:  Positive for back pain.  Neurological:  Negative for numbness.  All other systems reviewed and are negative.  Physical Exam Updated Vital Signs BP (!) 189/83 (BP Location: Left Arm)  Pulse 63   Temp 98.6 F (37 C) (Oral)   Resp 18   Ht 5\' 5"  (1.651 m)   Wt 93.9 kg   SpO2 100%   BMI 34.45 kg/m   Physical Exam Vitals and nursing note reviewed.  Constitutional:      General: She is not in acute distress.    Appearance: She is not ill-appearing.  HENT:     Head: Normocephalic.  Eyes:     Pupils: Pupils are equal, round, and reactive to light.  Cardiovascular:     Rate and Rhythm: Normal rate and regular rhythm.     Pulses: Normal pulses.     Heart sounds: Normal heart sounds. No murmur heard.   No friction rub. No gallop.  Pulmonary:     Effort: Pulmonary effort is normal.     Breath sounds: Normal breath sounds.   Abdominal:     General: Abdomen is flat. There is no distension.     Palpations: Abdomen is soft.     Tenderness: There is no abdominal tenderness. There is no guarding or rebound.     Comments: Abdomen soft, nondistended, nontender to palpation in all quadrants without guarding or peritoneal signs. No rebound.   Musculoskeletal:        General: Normal range of motion.     Cervical back: Neck supple.     Comments: No thoracic or lumbar midline tenderness.  No lumbar paraspinal tenderness.  Bilateral lower extremities neurovascularly intact with soft compartments.  Patient able to ambulate in the ED without difficulty.  Skin:    General: Skin is warm and dry.  Neurological:     General: No focal deficit present.     Mental Status: She is alert.  Psychiatric:        Mood and Affect: Mood normal.        Behavior: Behavior normal.    ED Results / Procedures / Treatments   Labs (all labs ordered are listed, but only abnormal results are displayed) Labs Reviewed - No data to display  EKG None  Radiology No results found.  Procedures Procedures   Medications Ordered in ED Medications  lidocaine (LIDODERM) 5 % 2 patch (2 patches Transdermal Patch Applied 03/06/21 1332)    ED Course  I have reviewed the triage vital signs and the nursing notes.  Pertinent labs & imaging results that were available during my care of the patient were reviewed by me and considered in my medical decision making (see chart for details).    MDM Rules/Calculators/A&P                          55 year old female presents to the ED due to low back pain x4 days after walking on uneven terrain in wedged sandals.  Patient states she also helped her child move into an apartment over the weekend.  No direct injury.  Patient denies saddle paresthesias, bowel/bladder incontinence, lower extremity numbness/tingling, lower extremity weakness, IV drug use, fever/chills, and history of cancer.  Patient admits to  remote history of low back pain.  Upon arrival, stable vitals.  Patient is afebrile, not tachycardic or hypoxic.  Patient no acute distress.  No thoracic or lumbar midline tenderness.  No lumbar paraspinal tenderness.  Bilateral lower extremities neurovascularly intact with soft compartments.  Patient able to ambulate in the ED without difficulty.  Low suspicion for cauda equina or central cord compression.  No infectious symptoms to suggest infectious etiology.  Suspect muscular etiology.  Lidoderm patches placed here in the ED.  Patient discharged with naproxen, Robaxin, and low back exercises.  Advised patient follow-up with PCP symptoms not improve over the next week. Strict ED precautions discussed with patient. Patient states understanding and agrees to plan. Patient discharged home in no acute distress and stable vitals  Final Clinical Impression(s) / ED Diagnoses Final diagnoses:  Acute bilateral low back pain without sciatica    Rx / DC Orders ED Discharge Orders          Ordered    methocarbamol (ROBAXIN) 500 MG tablet  2 times daily        03/06/21 1325    naproxen (NAPROSYN) 500 MG tablet  2 times daily        03/06/21 1325             Jesusita Oka 03/06/21 1341    Benjiman Core, MD 03/06/21 [id number]

## 2021-03-06 NOTE — ED Notes (Signed)
Lower L & R lumbar pain since attending a concert Friday night, states generalized lumbar pain that does not radiate. Pressure type pain. No urinary sx. Ibuprofen that was helpful previously but without any relief today.

## 2021-03-06 NOTE — Discharge Instructions (Signed)
It was a pleasure taking care of you today.  As discussed, I suspect your pain is related to a muscular strain.  I am sending you home with naproxen and Robaxin.  Muscle relaxer can cause drowsiness so do not drive or operate machinery while on the medication.  Low back pain can take up to 6 weeks to fully resolve.  I have included low back exercises.  Perform daily.  You may also purchase over-the-counter Lidoderm patches and Voltaren gel for added pain relief.  Please follow-up with PCP if symptoms not improved over the next week.  Return to the ER for new or worsening symptoms.

## 2021-03-06 NOTE — ED Triage Notes (Signed)
Pt c/o lower back pain that started after walking on uneven terrain 8/12-now pain is to right hip-denies injury-NAD-slow gait

## 2021-05-31 ENCOUNTER — Encounter: Payer: Self-pay | Admitting: Cardiology

## 2021-05-31 ENCOUNTER — Other Ambulatory Visit: Payer: Self-pay

## 2021-05-31 ENCOUNTER — Telehealth: Payer: Self-pay

## 2021-05-31 ENCOUNTER — Ambulatory Visit: Payer: 59 | Admitting: Cardiology

## 2021-05-31 VITALS — BP 120/78 | HR 56 | Ht 65.0 in | Wt 208.0 lb

## 2021-05-31 DIAGNOSIS — E669 Obesity, unspecified: Secondary | ICD-10-CM

## 2021-05-31 DIAGNOSIS — I471 Supraventricular tachycardia: Secondary | ICD-10-CM | POA: Diagnosis not present

## 2021-05-31 DIAGNOSIS — I4729 Other ventricular tachycardia: Secondary | ICD-10-CM

## 2021-05-31 MED ORDER — METOPROLOL SUCCINATE ER 25 MG PO TB24: 12.5000 mg | ORAL_TABLET | Freq: Every day | ORAL | 3 refills | Status: DC

## 2021-05-31 NOTE — Progress Notes (Signed)
Cardiology Office Note:    Date:  05/31/2021   ID:  Matthew Pais [facility], DOB February 17, 1966, MRN [id number]  PCP:  Patient, [name] Per (Inactive)  Cardiologist:  Thomasene Ripple, DO  Electrophysiologist:  None   Referring MD: No ref. provider found   Chief Complaint  Patient presents with   Follow-up    1 year.    History of Present Illness:    Becky Wang is a 55 y.o. female with a hx of  paroxysmal atrial tachycardia on metoprolol, 13 beats of NSVT on metoprolol,  systolic murmur with normal echocardiogram presents today for follow-up visit.  I did see the patient in February 2021 at that time she appeared to be doing better on her metoprolol.  Her palpitations have resolved.  No changes were made to her antihypertensive regimen  She followed up on 03/31/2020 at that visit she was doing well from a CV standpoint. She was tolerating her metoprolol. 1 year follow up was recommended.   Today she offers no complaints.   Past Medical History:  Diagnosis Date   Heart murmur    Vitamin D deficiency     Past Surgical History:  Procedure Laterality Date   CHOLECYSTECTOMY     WRIST SURGERY      Current Medications: Current Meds  Medication Sig   Multiple Vitamins-Minerals (MULTIVITAMIN WITH MINERALS) tablet Take 1 tablet by mouth daily.   oxybutynin (DITROPAN XL) 15 MG 24 hr tablet Take 15 mg by mouth daily.   [DISCONTINUED] metoprolol succinate (TOPROL-XL) 25 MG 24 hr tablet TAKE ONE-HALF TABLET BY  MOUTH DAILY     Allergies:   Patient has no known allergies.   Social History   Socioeconomic History   Marital status: Divorced    Spouse name: Not on file   Number of children: Not on file   Years of education: Not on file   Highest education level: Not on file  Occupational History   Not on file  Tobacco Use   Smoking status: Never   Smokeless tobacco: Never  Vaping Use   Vaping Use: Never used  Substance and Sexual Activity   Alcohol use: Yes    Comment: occ   Drug  use: No   Sexual activity: Yes    Birth control/protection: I.U.D.  Other Topics Concern   Not on file  Social History Narrative   Not on file   Social Determinants of Health   Financial Resource Strain: Not on file  Food Insecurity: Not on file  Transportation Needs: Not on file  Physical Activity: Not on file  Stress: Not on file  Social Connections: Not on file     Family History: The patient's family history includes Congestive Heart Failure in an other family member; Fibromyalgia in her sister; Heart attack in her father; Hypertension in her mother; Other in her sister.  ROS:   Review of Systems  Constitution: Negative for decreased appetite, fever and weight gain.  HENT: Negative for congestion, ear discharge, hoarse voice and sore throat.   Eyes: Negative for discharge, redness, vision loss in right eye and visual halos.  Cardiovascular: Negative for chest pain, dyspnea on exertion, leg swelling, orthopnea and palpitations.  Respiratory: Negative for cough, hemoptysis, shortness of breath and snoring.   Endocrine: Negative for heat intolerance and polyphagia.  Hematologic/Lymphatic: Negative for bleeding problem. Does not bruise/bleed easily.  Skin: Negative for flushing, nail changes, rash and suspicious lesions.  Musculoskeletal: Negative for arthritis, joint pain, muscle cramps, myalgias,  neck pain and stiffness.  Gastrointestinal: Negative for abdominal pain, bowel incontinence, diarrhea and excessive appetite.  Genitourinary: Negative for decreased libido, genital sores and incomplete emptying.  Neurological: Negative for brief paralysis, focal weakness, headaches and loss of balance.  Psychiatric/Behavioral: Negative for altered mental status, depression and suicidal ideas.  Allergic/Immunologic: Negative for HIV exposure and persistent infections.    EKGs/Labs/Other Studies Reviewed:    The following studies were reviewed today:   EKG:  None today  ZIO  monitor on May 06, 2019: The patient wore the monitor for 13 days, 20 hours.  Indication: Palpitations Minimum HR of 43 bpm, maximum HR of 226 bpm, and avg HR of 70 bpm.  Predominant underlying rhythm was Sinus Rhythm.  1 run of Ventricular Tachycardia occurred lasting 13 beats with a max rate of 226bpm (avg 198 bpm).  A total of 6 Supraventricular Tachycardia runs occurred, the run with the fastest interval lasting 19 beats with a max rate of 167 bpm, the longest lasting 16 beats with an avg rate of 104 bpm.  Premature atrial complexes were rare (<1.0%). Premature ventricular complexes were rare (<1.0%). Ventricular Triplets were present. No AV block, No pauses and no atrial fibrillation.  Conclusion:  This study is remarkable for a 13-beats run of ventricular tachycardia and 6 runs of supraventricular tachycardia which is likely atrial tachycardia.     Transthoracic echocardiogram IMPRESSIONS 04/08/2019.  1. Left ventricular ejection fraction, by visual estimation, is 60 to 65%. The left ventricle has normal function. Normal left ventricular size. There is borderline left ventricular hypertrophy.  2. Global right ventricle has normal systolic function.The right ventricular size is normal. No increase in right ventricular wall thickness.  3. Left atrial size was normal.  4. Right atrial size was normal.  5. The mitral valve is normal in structure. No evidence of mitral valve regurgitation. No evidence of mitral stenosis.  6. The tricuspid valve is normal in structure. Tricuspid valve regurgitation is trivial.  7. The aortic valve is normal in structure. Aortic valve regurgitation was not visualized by color flow Doppler. Structurally normal aortic valve, with no evidence of sclerosis or stenosis.  8. The pulmonic valve was normal in structure. Pulmonic valve regurgitation is not visualized by color flow Doppler.  9. Normal pulmonary artery systolic pressure. 10. The inferior vena cava is  normal in size with greater than 50% respiratory variability, suggesting right atrial pressure of 3 mmHg.   CTA coronary IMPRESSION:  1. Coronary calcium score of 0. This was 0 percentile for age and sex matched control.  2. Normal coronary origin with right dominance.  3. No evidence of calcified plaque. Motion artifact in the mid RCA and mid to distal LAD with poorly dilated vessels limits full assessment for noncalcified plaque.     Recent Labs: No results found for requested labs within last 8760 hours.  Recent Lipid Panel No results found for: CHOL, TRIG, HDL, CHOLHDL, VLDL, LDLCALC, LDLDIRECT  Physical Exam:    VS:  BP 120/78 (BP Location: Right Arm, Patient Position: Sitting, Cuff Size: Large)   Pulse (!) 56   Ht 5\' 5"  (1.651 m)   Wt 208 lb (94.3 kg)   BMI 34.61 kg/m     Wt Readings from Last 3 Encounters:  05/31/21 208 lb (94.3 kg)  03/06/21 207 lb (93.9 kg)  03/31/20 197 lb (89.4 kg)     GEN: Well nourished, well developed in no acute distress HEENT: Normal NECK: No JVD; No carotid bruits LYMPHATICS:  No lymphadenopathy CARDIAC: S1S2 noted,RRR, no murmurs, rubs, gallops RESPIRATORY:  Clear to auscultation without rales, wheezing or rhonchi  ABDOMEN: Soft, non-tender, non-distended, +bowel sounds, no guarding. EXTREMITIES: No edema, No cyanosis, no clubbing MUSCULOSKELETAL:  No deformity  SKIN: Warm and dry NEUROLOGIC:  Alert and oriented x 3, non-focal PSYCHIATRIC:  Normal affect, good insight  ASSESSMENT:    1. PAT (paroxysmal atrial tachycardia) (HCC)   2. PSVT (paroxysmal supraventricular tachycardia) (HCC)   3. NSVT (nonsustained ventricular tachycardia)   4. Obesity (BMI 30-39.9)    PLAN:     She is doing well from a cardiovascular standpoint.  She is happy on her metoprolol succinate 12.5 mg daily her palpitations I feel is at baseline.  We will keep her on this medication regimen for now. The patient understands the need to lose weight with diet and  exercise. We have discussed specific strategies for this.  I also encouraged the patient to get a PCP.  She expresses that she is planning on getting a PCP.   The patient is in agreement with the above plan. The patient left the office in stable condition.  The patient will follow up in 1 year or sooner if needed.   Medication Adjustments/Labs and Tests Ordered: Current medicines are reviewed at length with the patient today.  Concerns regarding medicines are outlined above.  Orders Placed This Encounter  Procedures   EKG 12-Lead   Meds ordered this encounter  Medications   metoprolol succinate (TOPROL-XL) 25 MG 24 hr tablet    Sig: Take 0.5 tablets (12.5 mg total) by mouth daily.    Dispense:  45 tablet    Refill:  3    Requesting 1 year supply    Patient Instructions  Medication Instructions:  Your physician recommends that you continue on your current medications as directed. Please refer to the Current Medication list given to you today.  *If you need a refill on your cardiac medications before your next appointment, please call your pharmacy*   Lab Work: None If you have labs (blood work) drawn today and your tests are completely normal, you will receive your results only by: MyChart Message (if you have MyChart) OR A paper copy in the mail If you have any lab test that is abnormal or we need to change your treatment, we will call you to review the results.   Testing/Procedures: None   Follow-Up: At [facility], you and your health needs are our priority.  As part of our continuing mission to provide you with exceptional heart care, we have created designated Provider Care Teams.  These Care Teams include your primary Cardiologist (physician) and Advanced Practice Providers (APPs -  Physician Assistants and Nurse Practitioners) who all work together to provide you with the care you need, when you need it.  We recommend signing up for the patient portal called  "MyChart".  Sign up information is provided on this After Visit Summary.  MyChart is used to connect with patients for Virtual Visits (Telemedicine).  Patients are able to view lab/test results, encounter notes, upcoming appointments, etc.  Non-urgent messages can be sent to your provider as well.   To learn more about what you can do with MyChart, go to [email or web address].    Your next appointment:   1 year(s)  The format for your next appointment:   In Person  Provider:   Thomasene Ripple, DO     Other Instructions   Adopting a Healthy Lifestyle.  Know what  a healthy weight is for you (roughly BMI <25) and aim to maintain this   Aim for 7+ servings of fruits and vegetables daily   65-80+ fluid ounces of water or unsweet tea for healthy kidneys   Limit to max 1 drink of alcohol per day; avoid smoking/tobacco   Limit animal fats in diet for cholesterol and heart health - choose grass fed whenever available   Avoid highly processed foods, and foods high in saturated/trans fats   Aim for low stress - take time to unwind and care for your mental health   Aim for 150 min of moderate intensity exercise weekly for heart health, and weights twice weekly for bone health   Aim for 7-9 hours of sleep daily   When it comes to diets, agreement about the perfect plan isnt easy to find, even among the experts. Experts at the [place] [place] [employer] developed an idea known as the Healthy Eating Plate. Just imagine a plate divided into logical, healthy portions.   The emphasis is on diet quality:   Load up on vegetables and fruits - one-half of your plate: Aim for color and variety, and remember that potatoes dont count.   Go for whole grains - one-quarter of your plate: Whole wheat, barley, wheat berries, quinoa, oats, brown rice, and foods made with them. If you want pasta, go with whole wheat pasta.   Protein power - one-quarter of your plate: Fish, chicken, beans, and nuts  are all healthy, versatile protein sources. Limit red meat.   The diet, however, does go beyond the plate, offering a few other suggestions.   Use healthy plant oils, such as olive, canola, soy, corn, sunflower and peanut. Check the labels, and avoid partially hydrogenated oil, which have unhealthy trans fats.   If youre thirsty, drink water. Coffee and tea are good in moderation, but skip sugary drinks and limit milk and dairy products to one or two daily servings.   The type of carbohydrate in the diet is more important than the amount. Some sources of carbohydrates, such as vegetables, fruits, whole grains, and beans-are healthier than others.   Finally, stay active  Signed, Thomasene Ripple, DO  05/31/2021 9:25 PM    [facility]

## 2021-05-31 NOTE — Telephone Encounter (Signed)
Called pt to insure she will come the [place] office. No answer, I left a message with the address.

## 2021-05-31 NOTE — Patient Instructions (Signed)

## 2021-08-14 IMAGING — CT CT HEART MORP W/ CTA COR W/ SCORE W/ CA W/CM &/OR W/O CM
4 of 7 series · 8 of 20 positions shown, 9 images · IV contrast (APPLIED)
Comparison: None.
COMPARISON: None.

Addendum:
EXAM:
OVER-READ INTERPRETATION  CT CHEST

The following report is an over-read performed by radiologist Dr.
Hermel Diguay [REDACTED] on 05/07/2019. This
over-read does not include interpretation of cardiac or coronary
anatomy or pathology. The coronary CTA interpretation by the
cardiologist is attached.
TECHNIQUE: The patient was scanned on a Phillips Force scanner.

[Series 6: best diast 73 % · axial · 0.35mm/px · z∈[+1115,+1158]mm · 2 of 323 slices shown, 3 images]
[im 108/323  vessel]
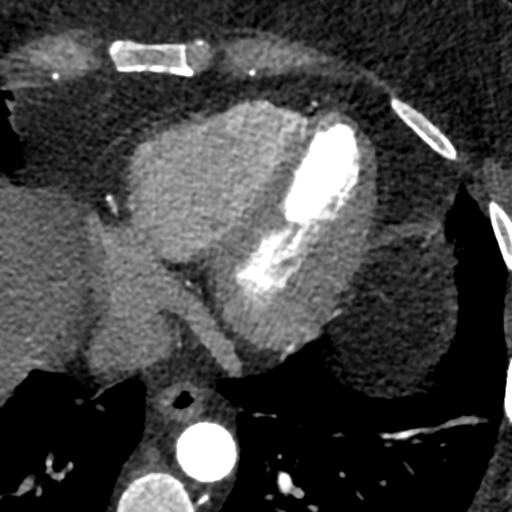
[im 108/323  lung]
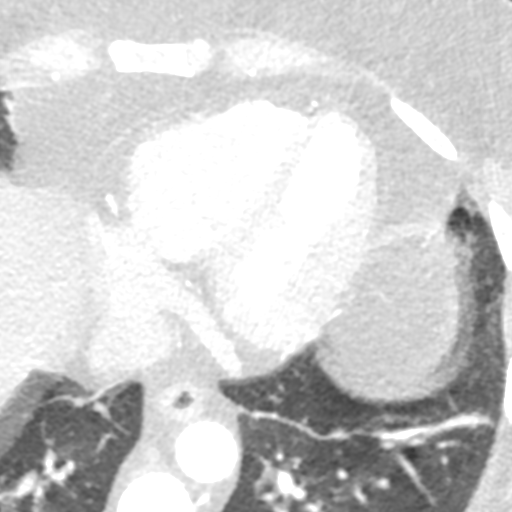
[im 215/323  vessel]
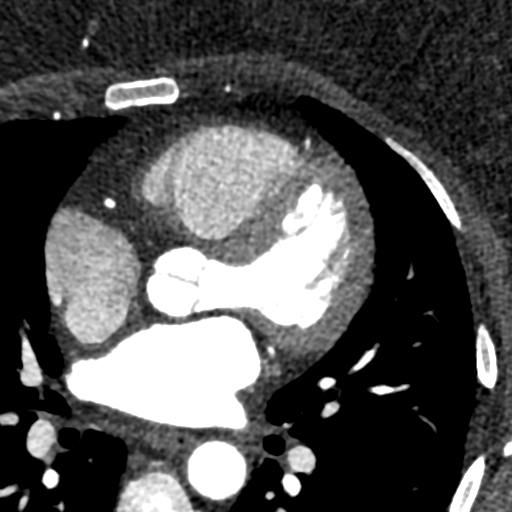

[Series 7: best syst 73 % · axial · 0.35mm/px · z∈[+1115,+1158]mm · 2 of 323 slices shown]
[im 108/323  vessel]
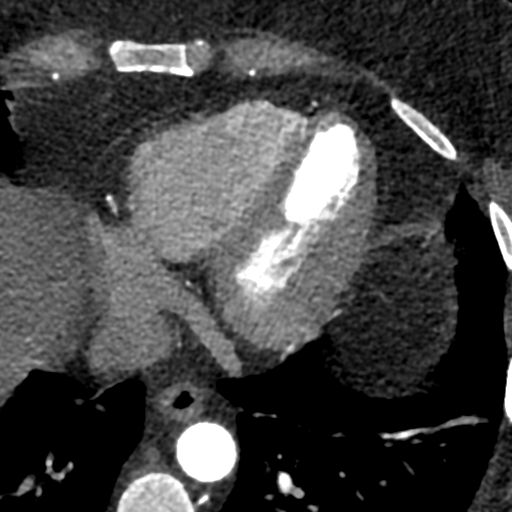
[im 215/323  vessel]
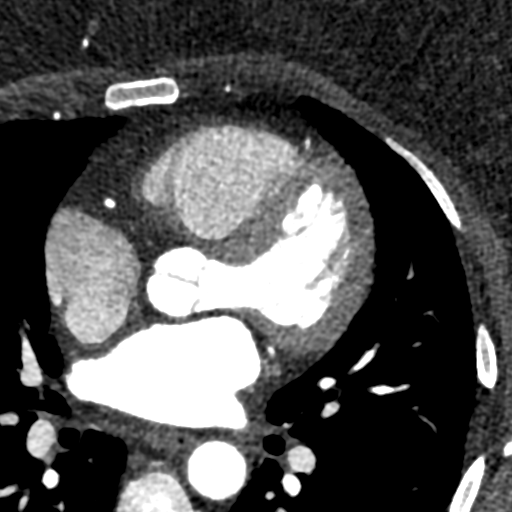

[Series 8: ts diast sharp 73 % · axial · 0.35mm/px · z∈[+1115,+1158]mm · 2 of 323 slices shown]
[im 108/323  lung]
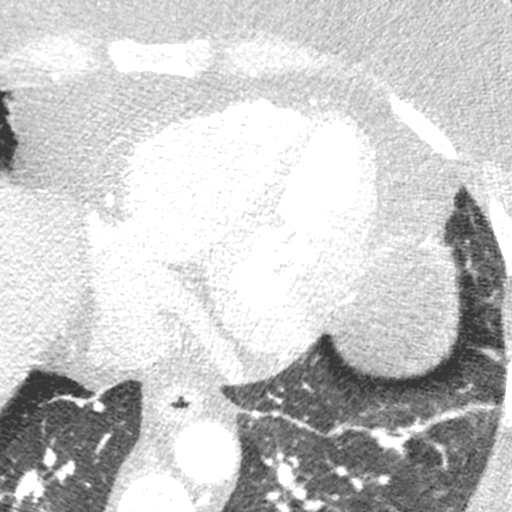
[im 215/323  lung]
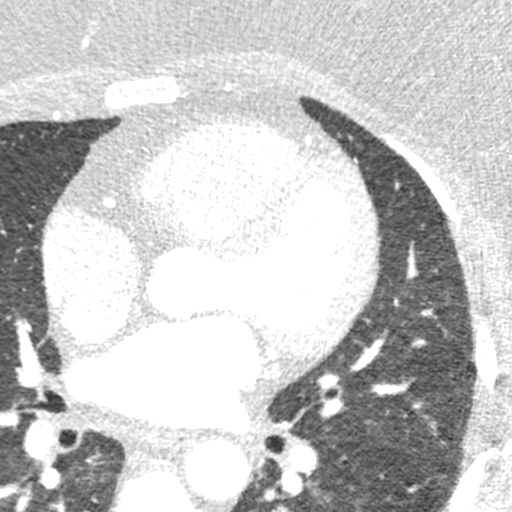

[Series 9: ts syst sharp 73 % · axial · 0.35mm/px · z∈[+1115,+1158]mm · 2 of 323 slices shown]
[im 108/323  lung]
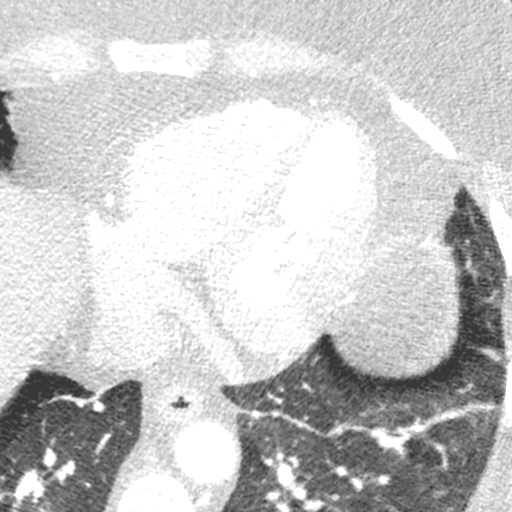
[im 215/323  lung]
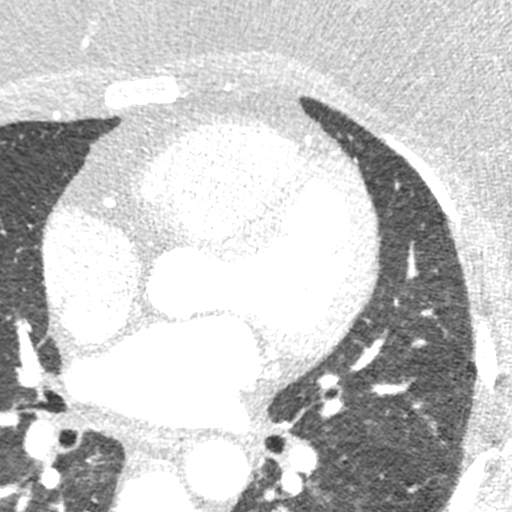

[8 of 20 positions shown; findings below may reference images not displayed]

FINDINGS: Vascular: Heart is normal size.  Visualized aorta normal caliber.

Mediastinum/Nodes: No adenopathy in the lower mediastinum or hila.

Lungs/Pleura: Linear atelectasis or scarring in the lung bases. No
effusions.

Upper Abdomen: Imaging into the upper abdomen shows no acute
findings.

Musculoskeletal: Chest wall soft tissues are unremarkable. No acute
bony abnormality.
IMPRESSION: No acute or significant extracardiac abnormality.

EXAM:
Cardiac/Coronary  CT
FINDINGS: A 120 kV prospective scan was triggered in the descending thoracic
aorta at 111 HU's. Axial non-contrast 3 mm slices were carried out
through the heart. The data set was analyzed on a dedicated work
station and scored using the Agatson method. Gantry rotation speed
was 250 msecs and collimation was .6 mm. No beta blockade and 0.8 mg
of sl NTG was given. The 3D data set was reconstructed in 5%
intervals of the 67-82 % of the R-R cycle. Diastolic phases were
analyzed on a dedicated work station using MPR, MIP and VRT modes.
The patient received 80 cc of contrast.

Aorta:  Normal size.  No calcifications.  No dissection.

Aortic Valve:  Trileaflet.  No calcifications.

Coronary Arteries:  Normal coronary origin.  Right dominance.

RCA is a large dominant artery that gives rise to PDA and PLVB.
There is no plaque in the proximal RCA. There is motion artifact in
the mid and distal RCA and therefore cannot noncalcified plaque.
There is no calcified plaque.

Left main is a large artery that gives rise to LAD, Ramus and LCX
arteries. There is no plaque.

LAD is a large vessel that gives rise to a moderate sized diagona.
The proximal LAD has no plaque but the mid LAD has motion artifact
and the mid and distal vessel appear poorly dilated. Cannot assess
mid and distal vessel.

Ramus is a very small vessel with no plaque.

LCX is a non-dominant artery that gives rise to an OM1 branch and
OM2 branch. There is no plaque.

Other findings:

Normal pulmonary vein drainage into the left atrium.

Normal let atrial appendage without a thrombus.

Normal size of the pulmonary artery.
IMPRESSION: 1. Coronary calcium score of 0. This was 0 percentile for age and
sex matched control.

2. Normal coronary origin with right dominance.

3. No evidence of calcified plaque. Motion artifact in the mid RCA
and mid to distal LAD with poorly dilated vessels limits full
assessment for noncalcified plaque.

4. Consider other imaging modality to assess for ischemia such as
Lexiscan myoview or stress echo.

Rox Tiger

*** End of Addendum ***
EXAM:
OVER-READ INTERPRETATION  CT CHEST

The following report is an over-read performed by radiologist Dr.
Hermel Diguay [REDACTED] on 05/07/2019. This
over-read does not include interpretation of cardiac or coronary
anatomy or pathology. The coronary CTA interpretation by the
cardiologist is attached.
FINDINGS: Vascular: Heart is normal size.  Visualized aorta normal caliber.

Mediastinum/Nodes: No adenopathy in the lower mediastinum or hila.

Lungs/Pleura: Linear atelectasis or scarring in the lung bases. No
effusions.

Upper Abdomen: Imaging into the upper abdomen shows no acute
findings.

Musculoskeletal: Chest wall soft tissues are unremarkable. No acute
bony abnormality.
IMPRESSION: No acute or significant extracardiac abnormality.

## 2022-04-10 ENCOUNTER — Other Ambulatory Visit: Payer: Self-pay | Admitting: Cardiology

## 2022-06-19 ENCOUNTER — Ambulatory Visit: Payer: 59 | Attending: Cardiology | Admitting: Cardiology

## 2022-06-19 ENCOUNTER — Encounter: Payer: Self-pay | Admitting: Cardiology

## 2022-06-19 VITALS — BP 122/82 | HR 52 | Ht 66.0 in | Wt 197.2 lb

## 2022-06-19 DIAGNOSIS — I4719 Other supraventricular tachycardia: Secondary | ICD-10-CM

## 2022-06-19 DIAGNOSIS — E669 Obesity, unspecified: Secondary | ICD-10-CM | POA: Diagnosis not present

## 2022-06-19 DIAGNOSIS — Z79899 Other long term (current) drug therapy: Secondary | ICD-10-CM | POA: Diagnosis not present

## 2022-06-19 DIAGNOSIS — I4729 Other ventricular tachycardia: Secondary | ICD-10-CM

## 2022-06-19 DIAGNOSIS — Z7689 Persons encountering health services in other specified circumstances: Secondary | ICD-10-CM

## 2022-06-19 MED ORDER — METOPROLOL SUCCINATE ER 25 MG PO TB24: 12.5000 mg | ORAL_TABLET | Freq: Every day | ORAL | 3 refills | Status: AC

## 2022-06-19 NOTE — Patient Instructions (Signed)
Medication Instructions:  Your physician recommends that you continue on your current medications as directed. Please refer to the Current Medication list given to you today.  *If you need a refill on your cardiac medications before your next appointment, please call your pharmacy*   Lab Work: Your physician recommends that you have the following labs drawn today: CMET and Magnesium.   If you have labs (blood work) drawn today and your tests are completely normal, you will receive your results only by: MyChart Message (if you have MyChart) OR A paper copy in the mail If you have any lab test that is abnormal or we need to change your treatment, we will call you to review the results.   Testing/Procedures: NONE   Follow-Up: At [facility], you and your health needs are our priority.  As part of our continuing mission to provide you with exceptional heart care, we have created designated Provider Care Teams.  These Care Teams include your primary Cardiologist (physician) and Advanced Practice Providers (APPs -  Physician Assistants and Nurse Practitioners) who all work together to provide you with the care you need, when you need it.  We recommend signing up for the patient portal called "MyChart".  Sign up information is provided on this After Visit Summary.  MyChart is used to connect with patients for Virtual Visits (Telemedicine).  Patients are able to view lab/test results, encounter notes, upcoming appointments, etc.  Non-urgent messages can be sent to your provider as well.   To learn more about what you can do with MyChart, go to [email or web address].    Your next appointment:   1 year(s)  The format for your next appointment:   In Person  Provider:   Thomasene Ripple, DO

## 2022-06-19 NOTE — Progress Notes (Unsigned)
Cardiology Office Note:    Date:  06/19/2022   ID:  Becky Wang [facility], DOB 11/18/1965, MRN [id number]  PCP:  Patient, [name] Per  Cardiologist:  Thomasene Ripple, DO  Electrophysiologist:  None   Referring MD: No ref. provider found   " I am doing well"  History of Present Illness:    Becky Wang is a 56 y.o. female with a hx of  paroxysmal atrial tachycardia on metoprolol, 13 beats of NSVT on metoprolol,  systolic murmur with normal echocardiogram presents today for follow-up visit.  I did see the patient in February 2021 at that time she appeared to be doing better on her metoprolol.  Her palpitations have resolved.  No changes were made to her antihypertensive regimen  She followed up on 03/31/2020 at that visit she was doing well from a CV standpoint. She was tolerating her metoprolol. 1 year follow up was recommended.   Her last visit was May 31, 2021 at that time she was here for follow-up.  Since I saw the patient she has been doing well.  No complaints at this time.  She tells me her beta-blocker is helping.  Today she offers no complaints at this time.  Past Medical History:  Diagnosis Date   Heart murmur    Vitamin D deficiency     Past Surgical History:  Procedure Laterality Date   CHOLECYSTECTOMY     WRIST SURGERY      Current Medications: Current Meds  Medication Sig   metoprolol succinate (TOPROL-XL) 25 MG 24 hr tablet TAKE ONE-HALF TABLET BY MOUTH  DAILY   Multiple Vitamins-Minerals (MULTIVITAMIN WITH MINERALS) tablet Take 1 tablet by mouth daily.   oxybutynin (DITROPAN XL) 15 MG 24 hr tablet Take 15 mg by mouth daily.     Allergies:   Patient has no known allergies.   Social History   Socioeconomic History   Marital status: Divorced    Spouse name: Not on file   Number of children: Not on file   Years of education: Not on file   Highest education level: Not on file  Occupational History   Not on file  Tobacco Use   Smoking status: Never    Smokeless tobacco: Never  Vaping Use   Vaping Use: Never used  Substance and Sexual Activity   Alcohol use: Yes    Comment: occ   Drug use: No   Sexual activity: Yes    Birth control/protection: I.U.D.  Other Topics Concern   Not on file  Social History Narrative   Not on file   Social Determinants of Health   Financial Resource Strain: Not on file  Food Insecurity: Not on file  Transportation Needs: Not on file  Physical Activity: Not on file  Stress: Not on file  Social Connections: Not on file     Family History: The patient's family history includes Congestive Heart Failure in an other family member; Fibromyalgia in her sister; Heart attack in her father; Hypertension in her mother; Other in her sister.  ROS:   Review of Systems  Constitution: Negative for decreased appetite, fever and weight gain.  HENT: Negative for congestion, ear discharge, hoarse voice and sore throat.   Eyes: Negative for discharge, redness, vision loss in right eye and visual halos.  Cardiovascular: Negative for chest pain, dyspnea on exertion, leg swelling, orthopnea and palpitations.  Respiratory: Negative for cough, hemoptysis, shortness of breath and snoring.   Endocrine: Negative for heat intolerance and polyphagia.  Hematologic/Lymphatic: Negative for bleeding problem. Does not bruise/bleed easily.  Skin: Negative for flushing, nail changes, rash and suspicious lesions.  Musculoskeletal: Negative for arthritis, joint pain, muscle cramps, myalgias, neck pain and stiffness.  Gastrointestinal: Negative for abdominal pain, bowel incontinence, diarrhea and excessive appetite.  Genitourinary: Negative for decreased libido, genital sores and incomplete emptying.  Neurological: Negative for brief paralysis, focal weakness, headaches and loss of balance.  Psychiatric/Behavioral: Negative for altered mental status, depression and suicidal ideas.  Allergic/Immunologic: Negative for HIV exposure and  persistent infections.    EKGs/Labs/Other Studies Reviewed:    The following studies were reviewed today:   EKG: Sinus bradycardia, heart rate 52 bpm.  ZIO monitor on May 06, 2019: The patient wore the monitor for 13 days, 20 hours.  Indication: Palpitations Minimum HR of 43 bpm, maximum HR of 226 bpm, and avg HR of 70 bpm.  Predominant underlying rhythm was Sinus Rhythm.  1 run of Ventricular Tachycardia occurred lasting 13 beats with a max rate of 226bpm (avg 198 bpm).  A total of 6 Supraventricular Tachycardia runs occurred, the run with the fastest interval lasting 19 beats with a max rate of 167 bpm, the longest lasting 16 beats with an avg rate of 104 bpm.  Premature atrial complexes were rare (<1.0%). Premature ventricular complexes were rare (<1.0%). Ventricular Triplets were present. No AV block, No pauses and no atrial fibrillation.  Conclusion:  This study is remarkable for a 13-beats run of ventricular tachycardia and 6 runs of supraventricular tachycardia which is likely atrial tachycardia.     Transthoracic echocardiogram IMPRESSIONS 04/08/2019.  1. Left ventricular ejection fraction, by visual estimation, is 60 to 65%. The left ventricle has normal function. Normal left ventricular size. There is borderline left ventricular hypertrophy.  2. Global right ventricle has normal systolic function.The right ventricular size is normal. No increase in right ventricular wall thickness.  3. Left atrial size was normal.  4. Right atrial size was normal.  5. The mitral valve is normal in structure. No evidence of mitral valve regurgitation. No evidence of mitral stenosis.  6. The tricuspid valve is normal in structure. Tricuspid valve regurgitation is trivial.  7. The aortic valve is normal in structure. Aortic valve regurgitation was not visualized by color flow Doppler. Structurally normal aortic valve, with no evidence of sclerosis or stenosis.  8. The pulmonic valve was  normal in structure. Pulmonic valve regurgitation is not visualized by color flow Doppler.  9. Normal pulmonary artery systolic pressure. 10. The inferior vena cava is normal in size with greater than 50% respiratory variability, suggesting right atrial pressure of 3 mmHg.   CTA coronary IMPRESSION:  1. Coronary calcium score of 0. This was 0 percentile for age and sex matched control.  2. Normal coronary origin with right dominance.  3. No evidence of calcified plaque. Motion artifact in the mid RCA and mid to distal LAD with poorly dilated vessels limits full assessment for noncalcified plaque.     Recent Labs: No results found for requested labs within last 365 days.  Recent Lipid Panel No results found for: "CHOL", "TRIG", "HDL", "CHOLHDL", "VLDL", "LDLCALC", "LDLDIRECT"  Physical Exam:    VS:  BP 122/82 (BP Location: Right Arm, Patient Position: Sitting, Cuff Size: Large)   Pulse (!) 52   Ht 5\' 6"  (1.676 m)   Wt 197 lb 3.2 oz (89.4 kg)   SpO2 98%   BMI 31.83 kg/m     Wt Readings from Last 3 Encounters:  06/19/22 197 lb 3.2 oz (89.4 kg)  05/31/21 208 lb (94.3 kg)  03/06/21 207 lb (93.9 kg)     GEN: Well nourished, well developed in no acute distress HEENT: Normal NECK: No JVD; No carotid bruits LYMPHATICS: No lymphadenopathy CARDIAC: S1S2 noted,RRR, no murmurs, rubs, gallops RESPIRATORY:  Clear to auscultation without rales, wheezing or rhonchi  ABDOMEN: Soft, non-tender, non-distended, +bowel sounds, no guarding. EXTREMITIES: No edema, No cyanosis, no clubbing MUSCULOSKELETAL:  No deformity  SKIN: Warm and dry NEUROLOGIC:  Alert and oriented x 3, non-focal PSYCHIATRIC:  Normal affect, good insight  ASSESSMENT:    1. NSVT (nonsustained ventricular tachycardia) (HCC)   2. PAT (paroxysmal atrial tachycardia)   3. Obesity (BMI 30-39.9)     PLAN:     We will continue patient on her beta-blocker.  No changes. The patient understands the need to lose weight with  diet and exercise. We have discussed specific strategies for this.  I also encouraged the patient to get a PCP.  I referred her to a PCP [facility].  The patient is in agreement with the above plan. The patient left the office in stable condition.  The patient will follow up in 1 year or sooner if needed.   Medication Adjustments/Labs and Tests Ordered: Current medicines are reviewed at length with the patient today.  Concerns regarding medicines are outlined above.  No orders of the defined types were placed in this encounter.  No orders of the defined types were placed in this encounter.   There are no Patient Instructions on file for this visit.   Adopting a Healthy Lifestyle.  Know what a healthy weight is for you (roughly BMI <25) and aim to maintain this   Aim for 7+ servings of fruits and vegetables daily   65-80+ fluid ounces of water or unsweet tea for healthy kidneys   Limit to max 1 drink of alcohol per day; avoid smoking/tobacco   Limit animal fats in diet for cholesterol and heart health - choose grass fed whenever available   Avoid highly processed foods, and foods high in saturated/trans fats   Aim for low stress - take time to unwind and care for your mental health   Aim for 150 min of moderate intensity exercise weekly for heart health, and weights twice weekly for bone health   Aim for 7-9 hours of sleep daily   When it comes to diets, agreement about the perfect plan isnt easy to find, even among the experts. Experts at the [employer] developed an idea known as the Healthy Eating Plate. Just imagine a plate divided into logical, healthy portions.   The emphasis is on diet quality:   Load up on vegetables and fruits - one-half of your plate: Aim for color and variety, and remember that potatoes dont count.   Go for whole grains - one-quarter of your plate: Whole wheat, barley, wheat berries, quinoa, oats, brown rice, and  foods made with them. If you want pasta, go with whole wheat pasta.   Protein power - one-quarter of your plate: Fish, chicken, beans, and nuts are all healthy, versatile protein sources. Limit red meat.   The diet, however, does go beyond the plate, offering a few other suggestions.   Use healthy plant oils, such as olive, canola, soy, corn, sunflower and peanut. Check the labels, and avoid partially hydrogenated oil, which have unhealthy trans fats.   If youre thirsty, drink water. Coffee and tea are  good in moderation, but skip sugary drinks and limit milk and dairy products to one or two daily servings.   The type of carbohydrate in the diet is more important than the amount. Some sources of carbohydrates, such as vegetables, fruits, whole grains, and beans-are healthier than others.   Finally, stay active  Signed, Thomasene Ripple, DO  06/19/2022 10:13 AM    [facility]

## 2022-06-20 LAB — COMPREHENSIVE METABOLIC PANEL
ALT: 23 IU/L (ref 0–32)
AST: 17 IU/L (ref 0–40)
Albumin/Globulin Ratio: 1.8 (ref 1.2–2.2)
Albumin: 4.4 g/dL (ref 3.8–4.9)
Alkaline Phosphatase: 84 IU/L (ref 44–121)
BUN/Creatinine Ratio: 21 (ref 9–23)
BUN: 13 mg/dL (ref 6–24)
Bilirubin Total: 0.3 mg/dL (ref 0.0–1.2)
CO2: 27 mmol/L (ref 20–29)
Calcium: 9.6 mg/dL (ref 8.7–10.2)
Chloride: 107 mmol/L — ABNORMAL HIGH (ref 96–106)
Creatinine, Ser: 0.63 mg/dL (ref 0.57–1.00)
Globulin, Total: 2.4 g/dL (ref 1.5–4.5)
Glucose: 97 mg/dL (ref 70–99)
Potassium: 5 mmol/L (ref 3.5–5.2)
Sodium: 143 mmol/L (ref 134–144)
Total Protein: 6.8 g/dL (ref 6.0–8.5)
eGFR: 105 mL/min/{1.73_m2} (ref >59)

## 2022-06-20 LAB — MAGNESIUM: Magnesium: 2.1 mg/dL (ref 1.6–2.3)

## 2023-05-18 ENCOUNTER — Other Ambulatory Visit: Payer: Self-pay | Admitting: Cardiology

## 2024-02-04 ENCOUNTER — Other Ambulatory Visit: Payer: Self-pay | Admitting: Medical Genetics

## 2024-02-16 ENCOUNTER — Other Ambulatory Visit (HOSPITAL_COMMUNITY)

## 2024-03-02 ENCOUNTER — Other Ambulatory Visit (HOSPITAL_COMMUNITY)
Admission: RE | Admit: 2024-03-02 | Discharge: 2024-03-02 | Disposition: A | Discharge status: Home / Self Care | Payer: Self-pay | Source: Ambulatory Visit | Attending: Medical Genetics | Admitting: Medical Genetics

## 2024-03-12 LAB — GENECONNECT MOLECULAR SCREEN: Genetic Analysis Overall Interpretation: NEGATIVE
